# Patient Record
Sex: Female | Born: 1965 | Race: White | Hispanic: No | Smoking: Current every day smoker
Health system: Southern US, Community
[De-identification: ages and names within clinical notes are randomized; demographics above are authoritative.]

## PROBLEM LIST (undated history)

## (undated) DIAGNOSIS — Z973 Presence of spectacles and contact lenses: Secondary | ICD-10-CM

## (undated) DIAGNOSIS — Z9889 Other specified postprocedural states: Secondary | ICD-10-CM

## (undated) DIAGNOSIS — I10 Essential (primary) hypertension: Secondary | ICD-10-CM

## (undated) DIAGNOSIS — J45909 Unspecified asthma, uncomplicated: Secondary | ICD-10-CM

## (undated) DIAGNOSIS — Z8679 Personal history of other diseases of the circulatory system: Secondary | ICD-10-CM

## (undated) DIAGNOSIS — A630 Anogenital (venereal) warts: Secondary | ICD-10-CM

## (undated) DIAGNOSIS — Z8719 Personal history of other diseases of the digestive system: Secondary | ICD-10-CM

---

## 1997-12-17 ENCOUNTER — Inpatient Hospital Stay (HOSPITAL_COMMUNITY): Admission: EM | Admit: 1997-12-17 | Discharge: 1997-12-23 | Payer: Self-pay | Admitting: Emergency Medicine

## 1998-05-17 HISTORY — PX: OTHER SURGICAL HISTORY: SHX169

## 1998-09-12 ENCOUNTER — Other Ambulatory Visit: Admission: RE | Admit: 1998-09-12 | Discharge: 1998-09-12 | Payer: Self-pay | Admitting: Obstetrics and Gynecology

## 2003-01-07 ENCOUNTER — Encounter: Admission: RE | Admit: 2003-01-07 | Discharge: 2003-01-07 | Payer: Self-pay | Admitting: General Surgery

## 2003-01-07 ENCOUNTER — Encounter: Payer: Self-pay | Admitting: General Surgery

## 2003-01-16 ENCOUNTER — Ambulatory Visit (HOSPITAL_COMMUNITY): Admission: RE | Admit: 2003-01-16 | Discharge: 2003-01-16 | Payer: Self-pay | Admitting: Gastroenterology

## 2003-01-18 ENCOUNTER — Encounter: Payer: Self-pay | Admitting: General Surgery

## 2003-01-18 ENCOUNTER — Inpatient Hospital Stay (HOSPITAL_COMMUNITY): Admission: EM | Admit: 2003-01-18 | Discharge: 2003-01-19 | Payer: Self-pay | Admitting: *Deleted

## 2003-01-18 ENCOUNTER — Encounter (INDEPENDENT_AMBULATORY_CARE_PROVIDER_SITE_OTHER): Payer: Self-pay | Admitting: Specialist

## 2003-01-18 HISTORY — PX: LAPAROSCOPIC CHOLECYSTECTOMY: SUR755

## 2003-03-01 ENCOUNTER — Emergency Department (HOSPITAL_COMMUNITY): Admission: EM | Admit: 2003-03-01 | Discharge: 2003-03-01 | Payer: Self-pay | Admitting: Emergency Medicine

## 2003-03-01 ENCOUNTER — Encounter: Payer: Self-pay | Admitting: Emergency Medicine

## 2004-07-20 ENCOUNTER — Other Ambulatory Visit: Admission: RE | Admit: 2004-07-20 | Discharge: 2004-07-20 | Payer: Self-pay | Admitting: Obstetrics and Gynecology

## 2005-12-31 ENCOUNTER — Observation Stay (HOSPITAL_COMMUNITY): Admission: EM | Admit: 2005-12-31 | Discharge: 2006-01-02 | Payer: Self-pay | Admitting: Emergency Medicine

## 2006-01-07 ENCOUNTER — Ambulatory Visit (HOSPITAL_COMMUNITY): Admission: RE | Admit: 2006-01-07 | Discharge: 2006-01-07 | Payer: Self-pay | Admitting: Internal Medicine

## 2006-03-17 ENCOUNTER — Inpatient Hospital Stay (HOSPITAL_COMMUNITY): Admission: RE | Admit: 2006-03-17 | Discharge: 2006-03-20 | Payer: Self-pay | Admitting: Neurosurgery

## 2006-03-17 HISTORY — PX: OTHER SURGICAL HISTORY: SHX169

## 2007-06-22 ENCOUNTER — Emergency Department (HOSPITAL_COMMUNITY): Admission: EM | Admit: 2007-06-22 | Discharge: 2007-06-22 | Payer: Self-pay | Admitting: Emergency Medicine

## 2009-02-10 ENCOUNTER — Emergency Department (HOSPITAL_COMMUNITY): Admission: EM | Admit: 2009-02-10 | Discharge: 2009-02-10 | Payer: Self-pay | Admitting: Emergency Medicine

## 2010-06-06 ENCOUNTER — Encounter: Payer: Self-pay | Admitting: Radiology

## 2010-10-02 NOTE — H&P (Signed)
NAME:  Kendra Becker, Kendra Becker                          ACCOUNT NO.:  [id number]   MEDICAL RECORD NO.:  [id number]                   PATIENT TYPE:  INP   LOCATION:  0378                                 FACILITY:  [facility]   PHYSICIAN:  Adolph Pollack, M.D.            DATE OF BIRTH:  13-Jan-1966   DATE OF ADMISSION:  01/18/2003  DATE OF DISCHARGE:                                HISTORY & PHYSICAL   CHIEF COMPLAINT:  Increasing right upper quadrant pain radiating to the back  with nausea.   HISTORY OF PRESENT ILLNESS:  Ms. Kendra Becker is a 45 year old female who I saw in  the office January 03, 2003 with some biliary colic type pain, but also some  dyspeptic type pain.  She has known gallstones by a CT scan in 1999 and a  recent ultrasound also demonstrated the gallstones.  We talked about an  elective laparoscopic cholecystectomy but first I sent her to Petra Kuba,  M.D. to evaluate her for dyspepsia and a hiatal hernia was noted on an upper  GI evaluation.  The plan was to then set her up for elective  cholecystectomy.  She saw Jimmye Norman, M.D. in the urgent office yesterday  with more biliary colic type symptoms.  She presented to the emergency  department because of persistent pain this morning.  No fever.  Having  nausea.   PAST MEDICAL HISTORY:  1. Acute appendicitis.  2. Gastroesophageal reflux disease.  3. Hypertension.  4. Depression.  5. Hypercholesterolemia.   PAST SURGICAL HISTORY:  1. Laparoscopic appendectomy.  2. Partial cecectomy.   ALLERGIES:  None.   MEDICATIONS:  1. Nexium.  2. Prozac.  3. Uniretic.  4. Zantac.   SOCIAL HISTORY:  She smokes one and a half packs of cigarettes a day.  Occasional alcohol use.  She is divorced.  She does have a boyfriend.   FAMILY HISTORY:  Remarkable for the fact that her mother had gallbladder  disease.   REVIEW OF SYSTEMS:  CARDIOVASCULAR:  No known heart disease or valvular  disease.  PULMONARY:  No asthma, pneumonia,  tuberculosis.  GASTROINTESTINAL:  She stated that some time in the past a doctor told her she may have had  peptic ulcer disease.  No diverticulitis.  No hepatitis.  GENITOURINARY:  No  kidney stones or current urinary symptoms.  ENDOCRINE:  No diabetes or  thyroid disease.  She does have hypercholesterolemia that is diet controlled  by her report.  NEUROLOGIC:  No seizures.  HEMATOLOGIC:  No known bleeding  disorders or deep venous thrombosis.   PHYSICAL EXAMINATION:  GENERAL:  Slightly ill-appearing female, but very  pleasant and cooperative.  VITAL SIGNS:  Temperature 98.2, blood pressure 169/109, pulse 92.  HEENT:  Eyes:  Extraocular movements are intact.  No icterus noted.  NECK:  Supple without masses.  RESPIRATORY:  Breath sounds equal and clear.  Respirations unlabored.  CARDIOVASCULAR:  Heart demonstrates a regular rate and rhythm.  No murmur  heard.  No lower extremity edema.  ABDOMEN:  Soft with a small subumbilical scar as well as other small scars,  one in the right upper quadrant, one in the left groin area.  She has some  mild to moderate tenderness to palpation in the right upper quadrant, but  does have a positive Murphy's sign.  No masses felt.  EXTREMITIES:  Good muscle tone.  Full range of motion.   LABORATORY DATA:  White blood cell count 12,400 with a leftward shift.  Hemoglobin 14.1.  Liver function tests are within normal limits as is a  lipase.   IMPRESSION:  Biliary colic secondary to cholelithiasis and likely now has  evolved an acute cholecystitis as her pain is starting to become persistent.  Does have elevation of her white blood cell count.   PLAN:  Admit.  IV antibiotics.  Plan for laparoscopic cholecystectomy either  today or tomorrow.  In the office we have gone over procedure and the risks  and she understood those at that time.                                               Adolph Pollack, M.D.    Kari Baars  D:  01/18/2003  T:  01/18/2003   Job:  [id number]   cc:   Petra Kuba, M.D.  [address]  [place]  [place] [address]  Fax: [phone]

## 2010-10-02 NOTE — Op Note (Signed)
NAMEGESSELLE, FITZSIMONS NO.:  [id number]   MEDICAL RECORD NO.:  [id number]          PATIENT TYPE:  INP   LOCATION:  3112                         FACILITY:  [facility]   PHYSICIAN:  Cristi Loron, M.D.DATE OF BIRTH:  27-Oct-1965   DATE OF PROCEDURE:  03/17/2006  DATE OF DISCHARGE:                                 OPERATIVE REPORT   PREOPERATIVE DIAGNOSIS:  Anterior communicating artery aneurysm, unruptured.   POSTOPERATIVE DIAGNOSIS:  Anterior communicating artery aneurysm,  unruptured.   PROCEDURE:  Right pterional craniotomy for clipping of anterior  communicating artery segment aneurysm using microdissection.   SURGEON:  Cristi Loron, M.D.   ASSISTANT:  Hilda Lias, M.D.   ANESTHESIA:  General endotracheal.   ESTIMATED BLOOD LOSS:  100 cc.   SPECIMENS:  None.   DRAINS:  One subtemporal Jackson-Pratt drain.   COMPLICATIONS:  None.   BRIEF HISTORY:  The patient is a 45 year old white female, who was having  dizzy spells and was worked up with a brain MRI/MRA.  This demonstrated an  anterior communicating artery segment aneurysm.  This was followed up by a  cerebral arteriogram, which confirmed the aneurysm, which was approximately  a 5-mm anterior communicating artery segment aneurysm.  I discussed the  various treatment options with the patient, including serial imaging  studies/observation, aneurysm coiling and surgical clipping of the aneurysm.  The patient weighed the risks, benefits and alternatives of surgery and  decided to proceed with a craniotomy for clipping of the aneurysm.   DESCRIPTION OF PROCEDURE:  The patient was brought to the operating room by  the anesthesia team.  General endotracheal anesthesia was induced.  The  patient remained I the supine position.  A roll was placed under her right  shoulder and her calvarium was supported in a Mayfield 3-point headrest.  The head was turned to the left, exposing her right scalp.  The  right scalp  was then shaved and prepared with Betadine scrub and Betadine solution.  Sterile drapes were applied.  I then injected the area to be incised with  Marcaine with epinephrine solution.  I used a scalpel to make a typical  pterional-type incision on the right.  I used Raney clips for wound-edge  hemostasis.  I then used electrocautery to incise the temporalis fascia and  muscle.  I then used a periosteal elevator to expose the underlying  calvarium.  We used towel clips and rubber bands to provide exposure.  I  then used a high-speed drill to create a bur hole in the keyhole region,  and then I used the footplate device to create a typical right pterional  craniotomy type bone flap.  I then elevated the bone flap with a Penfield #1  and a periosteal elevator.   I should mention that prior to the craniotomy, we prepped the patient's  lumbar region, and I placed a lumbar drain using the Seldinger technique  without difficulty on the first pass.   At this point, we opened the lumbar drain.  I then incised the dura with the  15-blade scalpel and used  the Metzenbaum scissors the extend the opening of  the dura.  I tacked back the dural edges.  The brain was quite slack from  drainage of cerebrospinal fluid through the lumbar drain. Then, the body  halo was applied to the frame and we brought the operating microscope into  the field.  I carefully dissected along the inferior surface of the frontal  lobe and identified the right optic nerve through the arachnoid.  I used  microdissection to dissect the arachnoid to then expose the proximal  internal carotid artery just posterior to the optic nerve in the event of  aneurysmal rupture.  I did just enough dissection around the proximal  internal carotid artery to assure that we could place a temporary aneurysm  clip, if we had to.  I then used the microdissection and microscissors,  arachnoid knife, etc., to divide the proximal sylvian  fissure to provide a  little more exposure.  I then used the self-retaining retractors to very  gently elevate the frontal lobe off the floor of the calvarium, giving more  exposure to the basal cisterns.  I then changed the direction of my  dissection to medial, towards the interhemispheric fissure.  I easily  identified the distal A1 segment close to the interhemispheric fissure.  We  could see the very proximal segment of the aneurysm.  I then used suction  and irrigation and bipolar electrocautery to remove a minimal amount of the  gyrus rectus to give better exposure of the aneurysm.  This gave good  exposure of the aneurysm.  I dissected around it circumferentially, and then  I clearly identified the aneurysmal neck.  We identified both the right and  left A1 segment, the right and left A2 segment, and the anterior  communicating artery.  The aneurysm was pointing ventrally.  We then  selected a #5 Sugita aneurysm clip and placed the aneurysm clip along the  neck of the aneurysm, being careful not to occlude either A1 or a2 segments,  and we were able to spare the anterior communicating artery as well.  After  doing this, we carefully inspected the aneurysm clearly and the clip was  around the entire neck of the aneurysm, and there was no compromise of  either the A1 or A2 segments, or the anterior communicating artery.  We  confirmed this visually through visual inspection as well as by using the  micro-Doppler, which demonstrated flow through all of these segments.  We  then obtained hemostasis using bipolar electrocautery.  We irrigated the  wound out with Bacitracin solution to remove any blood from the subarachnoid  space.  We then placed a piece of Gelfoam, which had been soaked in  papaverine over the area of dissection around the aneurysm, and then we  removed the retractor.  We then reapproximated the patient's dura with interrupted 4-0 Nurolon suture, and then we placed a  large piece of Gelfoam  over the exposed dura, and then we reapproximated the patient's craniotomy  flap with titanium miniplates and screws.  We then placed a flat Al Pimple drain in the subtemporal space.  We reapproximated the temporalis  fascia and muscle with interrupted 2-0 Vicryl suture, and the galea with  interrupted 2-0 Vicryl suture.  We tunneled the drain out through a separate  stab wound.  We then reapproximated the patient's skin with stainless steel  staples.  The wound was then cleansed and coated with Bacitracin ointment.  A sterile dressing was  applied.  The drapes were removed and the patient was  subsequently extubated by the anesthesia team and transported to the  postanesthesia care unit in stable condition.  All sponge, instrument and  needle counts were correct at the end of this case.      Cristi Loron, M.D.  Electronically Signed     JDJ/MEDQ  D:  03/17/2006  T:  03/18/2006  Job:  [id number]   cc:   Hilda Lias, M.D.

## 2010-10-02 NOTE — Op Note (Signed)
NAME:  Kendra Becker, Kendra Becker                          ACCOUNT NO.:  [id number]   MEDICAL RECORD NO.:  [id number]                   PATIENT TYPE:  INP   LOCATION:  0378                                 FACILITY:  [facility]   PHYSICIAN:  Adolph Pollack, M.D.            DATE OF BIRTH:  1966-04-05   DATE OF PROCEDURE:  01/18/2003  DATE OF DISCHARGE:                                 OPERATIVE REPORT   PREOPERATIVE DIAGNOSIS:  Acute cholecystitis.   POSTOPERATIVE DIAGNOSIS:  Acute cholecystitis.   PROCEDURE:  Laparoscopic cholecystectomy.   SURGEON:  Adolph Pollack, M.D.   ASSISTANT:  Abigail Miyamoto, M.D.   ANESTHESIA:  General.   INDICATIONS FOR PROCEDURE:  Ms. Kendra Becker is a 45 year old female whose been  having biliary colic and was going to be set up for elective cholecystectomy  but having increasing frequency of pain and presents in the emergency  department with an elevated white blood cell count, right upper quadrant  pain, tenderness and some guarding. She subsequently was admitted, placed on  IV antibiotics and brought to the operating room.   TECHNIQUE:  She was seen in the holding room, brought to the operating room,  placed supine on the operating table and a general anesthetic was  administered. Her abdomen was sterilely prepped and draped. Local anesthetic  was infiltrated in the subumbilical region and previous transverse  subumbilical incision was reincised down to the fascia where a small  incision was made in the fascia and the peritoneal cavity was entered  bluntly and under direct vision. A pursestring suture of #0 Vicryl was  placed around the fascial edges. A Hasson trocar was introduced into the  peritoneal cavity and pneumoperitoneum created by insufflation of CO2 gas.   Next the laparoscope was introduced. From a previous appendectomy incision  adhesions were noted from the ascending colon to the right lower quadrant  abdominal wall. The patient was then  placed in the reverse Trendelenburg  position with the right side tilted slightly up. Under direct vision, an 11  mm trocar was placed through a similar size incision and two 5 mm trocars  placed through the right mid abdomen. The gallbladder was noted to be  somewhat edematous and mildly inflamed. It became more acutely inflamed as  we traveled toward the infundibulum. The fundus was grasped and retracted  toward the right shoulder. Using blunt dissection, I mobilized the  infundibulum of the gallbladder. The area around the cystic duct appeared to  be quite inflamed and edematous. I used blunt dissection to try to isolate  part of the cystic duct out. There was a small blood vessel directly on the  cystic duct. I put a clip just above the gallbladder cystic duct junction  and made a small incision in the cystic duct and the artery started to  bleed. I controlled this with cautery but also sealed off part of my  incision  in the cystic duct so I made another incision and by the time I  passed the cholangiocatheter in because of the friable inflamed tissue, the  cystic duct had avulsed itself from the gallbladder. I noted at the time  that there was a large stone that was obstructing the cystic duct at the  gallbladder neck. Because the liver function tests were normal, I decided to  not do a cholangiogram. The cystic duct stump was grasped, it was clipped  two times on the staying inside. The clips were solid all the way across and  there was no bile leak. Subsequently I began dissecting the gallbladder free  from the liver bed. I identified what appeared to be a large cystic artery,  but I wanted to trace all of this out because of its size and, when I did,  it was a large trunk with anterior and posterior branches.  I clipped the  trunk twice and __________inside and each branch once and divided them. I  then dissected the gallbladder free from the liver bed. It was very  edematous. Once  the gallbladder was dissected free from the liver bed, I put  it in an Endopouch bag. I then copiously irrigated out the gallbladder fossa  and bleeding points were controlled with the cautery. I inspected the area a  number of times and noted no bile leaking and also noted no bleeding.   Next, I irrigated the perihepatic area copiously with saline solution and  evacuated most of it. I then removed the gallbladder in the Endopouch bag  through the subumbilical incision and under laparoscopic vision closed the  subumbilical fascial defect by tightening up and tying down the pursestring  suture. The remaining trocars were removed and a pneumoperitoneum was  released. The skin incisions were closed with 4-0 Monocryl subcuticular  sutures. Steri-Strips and sterile dressings were applied.   She tolerated the procedure well without any apparent complications. She  subsequently was taken to the recovery room in satisfactory condition.                                               Adolph Pollack, M.D.    Kari Baars  D:  01/18/2003  T:  01/19/2003  Job:  [id number]   cc:   Petra Kuba, M.D.  [address]  [place]  [place] [address]  Fax: [phone]

## 2010-10-02 NOTE — Discharge Summary (Signed)
[name], [name] NO.:  [id number]   MEDICAL RECORD NO.:  [id number]          PATIENT TYPE:  INP   LOCATION:  3032                         FACILITY:  [facility]   PHYSICIAN:  Danae Orleans. Venetia Maxon, M.D.  DATE OF BIRTH:  [birth year]   DATE OF ADMISSION:  03/17/2006  DATE OF DISCHARGE:  03/20/2006                               DISCHARGE SUMMARY   REASON FOR ADMISSION:  1. Nonruptured cerebral aneurysm.  2. Tobacco use disorder.  3. Hypertension, not otherwise specified.  4. Esophageal reflux.  5. Personal history of tuberculosis.   FINAL DIAGNOSES:  1. Nonruptured cerebral aneurysm.  2. Tobacco use disorder.  3. Hypertension, not otherwise specified.  4. Esophageal reflux.  5. Personal history of tuberculosis.   HISTORY OF ILLNESS AND HOSPITAL COURSE:  Kendra Becker is a 45 year old  woman with an anterior communicating artery aneurysm which was found  after cerebral arteriogram.  It was elected for her to undergo pterional  craniotomy and clipping of aneurysm.  This was done, and she tolerated  the procedure without difficulty, and was doing mobilized, doing well on  March 20, 2006, and was discharged home, with instructions to follow  up in 10 days for staple removal.   DISCHARGE MEDICATIONS:  1. Percocet.  2. Dilantin.   DISCHARGE STATUS:  Good.      Danae Orleans. Venetia Maxon, M.D.  Electronically Signed     JDS/MEDQ  D:  05/05/2006  T:  05/06/2006  Job:  [id number]

## 2010-12-24 ENCOUNTER — Inpatient Hospital Stay (INDEPENDENT_AMBULATORY_CARE_PROVIDER_SITE_OTHER)
Admission: RE | Admit: 2010-12-24 | Discharge: 2010-12-24 | Disposition: A | Discharge status: Home / Self Care | Payer: BC Managed Care – PPO | Source: Ambulatory Visit | Attending: Emergency Medicine | Admitting: Emergency Medicine

## 2010-12-24 DIAGNOSIS — S139XXA Sprain of joints and ligaments of unspecified parts of neck, initial encounter: Secondary | ICD-10-CM

## 2011-02-05 LAB — INFLUENZA A AND B ANTIGEN (CONVERTED LAB): Influenza B Ag: NEGATIVE

## 2011-11-23 ENCOUNTER — Encounter (HOSPITAL_COMMUNITY): Payer: Self-pay | Admitting: *Deleted

## 2011-11-23 ENCOUNTER — Emergency Department (HOSPITAL_COMMUNITY)
Admission: EM | Admit: 2011-11-23 | Discharge: 2011-11-23 | Disposition: A | Discharge status: Home / Self Care | Payer: BC Managed Care – PPO | Attending: Emergency Medicine | Admitting: Emergency Medicine

## 2011-11-23 ENCOUNTER — Emergency Department (HOSPITAL_COMMUNITY): Payer: BC Managed Care – PPO

## 2011-11-23 DIAGNOSIS — M533 Sacrococcygeal disorders, not elsewhere classified: Secondary | ICD-10-CM

## 2011-11-23 DIAGNOSIS — R10819 Abdominal tenderness, unspecified site: Secondary | ICD-10-CM | POA: Insufficient documentation

## 2011-11-23 DIAGNOSIS — I10 Essential (primary) hypertension: Secondary | ICD-10-CM | POA: Insufficient documentation

## 2011-11-23 DIAGNOSIS — R110 Nausea: Secondary | ICD-10-CM | POA: Insufficient documentation

## 2011-11-23 DIAGNOSIS — R102 Pelvic and perineal pain: Secondary | ICD-10-CM

## 2011-11-23 DIAGNOSIS — N949 Unspecified condition associated with female genital organs and menstrual cycle: Secondary | ICD-10-CM | POA: Insufficient documentation

## 2011-11-23 DIAGNOSIS — M549 Dorsalgia, unspecified: Secondary | ICD-10-CM | POA: Insufficient documentation

## 2011-11-23 DIAGNOSIS — R350 Frequency of micturition: Secondary | ICD-10-CM | POA: Insufficient documentation

## 2011-11-23 HISTORY — DX: Essential (primary) hypertension: I10

## 2011-11-23 LAB — URINALYSIS, ROUTINE W REFLEX MICROSCOPIC
Glucose, UA: NEGATIVE mg/dL
Ketones, ur: NEGATIVE mg/dL
Leukocytes, UA: NEGATIVE
Nitrite: NEGATIVE
Protein, ur: NEGATIVE mg/dL

## 2011-11-23 LAB — WET PREP, GENITAL
WBC, Wet Prep HPF POC: NONE SEEN
Yeast Wet Prep HPF POC: NONE SEEN

## 2011-11-23 LAB — POCT PREGNANCY, URINE: Preg Test, Ur: NEGATIVE

## 2011-11-23 MED ORDER — TRAMADOL HCL 50 MG PO TABS: 50.0000 mg | ORAL_TABLET | Freq: Four times a day (QID) | ORAL | Status: AC | PRN

## 2011-11-23 MED ORDER — KETOROLAC TROMETHAMINE 60 MG/2ML IM SOLN: 60.0000 mg | Freq: Once | INTRAMUSCULAR | Status: DC

## 2011-11-23 MED ORDER — KETOROLAC TROMETHAMINE 30 MG/ML IJ SOLN
INTRAMUSCULAR | Status: AC
  Administered 2011-11-23: 60 mg
  Filled 2011-11-23: qty 2

## 2011-11-23 NOTE — ED Provider Notes (Signed)
History  This chart was scribed for Ward Givens, MD by Erskine Emery. This patient was seen in room TR07C/TR07C and the patient's care was started at 16:12.   CSN: [id number]  Arrival date & time 11/23/11  1513   First MD Initiated Contact with Patient 11/23/11 1612      Chief Complaint  Patient presents with  . Back Pain    (Consider location/radiation/quality/duration/timing/severity/associated sxs/prior treatment) HPI  Kendra Becker is a 46 y.o. female who presents to the Emergency Department complaining of worsening moderate back pain that radiates bilaterally to the lower abdomen. Patient states that the back pain has been intermittent for the past week, but became constant today. Pt reports associated pain after urination, nausea, hot flashes, and urinary frequency for the last couple days. Pt reports the pain is aggravated by walking and denies any vomiting, dysuria, vaginal discharge, or diarrhea. Pt reports this morning she had no pain starting to pee, but when she finished peeing, it felt like something was trying to come out of her but has not had that pain since. Pt denies any prior episodes of similar symptoms or any recent injuries. Pt denies any recent changes in activity. Pt denies any new sexual partners and reports a h/o tubal ligation. Pt is G#6 P#4 A#2 and her LNMP was 1.5-2 months ago.  Pt's PCP is [facility].   Past Medical History  Diagnosis Date  . Hypertension     Past Surgical History  Procedure Date  . Cerebral aneurysm repair   BTL  History reviewed. No pertinent family history.  History  Substance Use Topics  . Smoking status: Current Everyday Smoker -- 1.5 packs/day  . Smokeless tobacco: Not on file  . 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   Review of Systems    Constitutional: Negative for fever.  Gastrointestinal: Positive for nausea and abdominal pain. Negative for vomiting and diarrhea.  Genitourinary: Positive for frequency. Negative for dysuria and vaginal discharge.  Musculoskeletal: Positive for back pain.    Allergies  Review of patient's allergies indicates no known allergies.  Home Medications   Current Outpatient Rx  Name Route Sig Dispense Refill  . ALPRAZOLAM 0.5 MG PO TABS Oral Take 0.25 mg by mouth 2 (two) times daily as needed. For anxiety    . LISINOPRIL 20 MG PO TABS Oral Take 20 mg by mouth daily.      BP 177/108  Pulse 89  Temp 97.2 F (36.2 C) (Oral)  Resp 19  SpO2 99%  LMP 09/15/2011  Vital signs normal    Physical Exam  Nursing note and vitals reviewed. Constitutional: She is oriented to person, place, and time. She appears well-developed and well-nourished. No distress.  HENT:  Head: Normocephalic and atraumatic.  Mouth/Throat: Oropharynx is clear and moist.  Eyes: EOM are normal. Pupils are equal, round, and reactive to light.  Neck: Normal range of motion. Neck supple. No tracheal deviation present.  Cardiovascular: Normal rate, regular rhythm and normal heart sounds.   Pulmonary/Chest: Effort normal. No respiratory distress.       Rare wheeze.   Abdominal: Soft. Bowel sounds are normal. There is tenderness.  Tenderness diffusely in suprapubic region with no tenderness above the umbilical region.   Genitourinary:       Exam done after Toradol given which has improved her pain. Patient has normal external genitalia except for a few Pinero warts around her rectal area. Patient states her husband had a wart on his penis that fell off. Her uterus is normal sized and nontender, her adnexa are nontender and feel normal size. However patient feels much more comfortable than she did prior to receiving the Toradol. She has a small amount white discharge  Musculoskeletal: Normal range of motion. She  exhibits no edema.       Back tenderness over sacrum, not thoracic or lumbar.   Neurological: She is alert and oriented to person, place, and time.  Skin: Skin is warm and dry.  Psychiatric: She has a normal mood and affect. Her behavior is normal.    ED Course  Procedures (including critical care time)  Medications  ketorolac (TORADOL) injection 60 mg (60 mg Intramuscular Not Given 11/23/11 1737)  ketorolac (TORADOL) 30 MG/ML injection (60 mg  Given 11/23/11 1730)   Pt pain better after meds  20:10 turned over to P Dammen, PA to get [place] results and disposition  DIAGNOSTIC STUDIES: Oxygen Saturation is 99% on room air, normal by my interpretation.    COORDINATION OF CARE:  16:37--I discussed treatment plan including a pelvic exam with pt and pt agreed.  18:45--I checked in with the pt, informed her of current lab results. Will order U/S of pelvis and Transvaginal. Pt is agreeable at this time.    Pelvic US pending  1. Pelvic pain in female    Disposition per Orson Slick, PA   MDM  I personally performed the services described in this documentation, which was scribed in my presence. The recorded information has been reviewed and considered.  Devoria Albe, MD, Armando Gang          Ward Givens, MD 11/23/11 2016
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
## 2011-11-23 NOTE — ED Notes (Signed)
Patient transported to X-ray 

## 2011-11-23 NOTE — ED Notes (Signed)
Patient transported to Ultrasound 

## 2011-11-23 NOTE — ED Notes (Signed)
Inc. Worsening back pain, radiating to lower abd, but has resolved. Also, nauseated.

## 2011-11-24 NOTE — ED Provider Notes (Signed)
Medical screening examination/treatment/procedure(s) were performed by non-physician practitioner and as supervising physician I was immediately available for consultation/collaboration.    Blimy Napoleon R Ellouise Mcwhirter, MD 11/24/11 1504 

## 2013-01-25 ENCOUNTER — Other Ambulatory Visit: Payer: Self-pay | Admitting: Family Medicine

## 2013-01-25 DIAGNOSIS — Z1231 Encounter for screening mammogram for malignant neoplasm of breast: Secondary | ICD-10-CM

## 2013-02-07 ENCOUNTER — Inpatient Hospital Stay: Admission: RE | Admit: 2013-02-07 | Payer: BC Managed Care – PPO | Source: Ambulatory Visit

## 2013-04-16 ENCOUNTER — Encounter (HOSPITAL_COMMUNITY): Payer: Self-pay | Admitting: Emergency Medicine

## 2013-04-16 ENCOUNTER — Emergency Department (HOSPITAL_COMMUNITY): Payer: BC Managed Care – PPO

## 2013-04-16 ENCOUNTER — Emergency Department (HOSPITAL_COMMUNITY)
Admission: EM | Admit: 2013-04-16 | Discharge: 2013-04-17 | Disposition: A | Discharge status: Home / Self Care | Payer: BC Managed Care – PPO | Attending: Emergency Medicine | Admitting: Emergency Medicine

## 2013-04-16 DIAGNOSIS — F17200 Nicotine dependence, unspecified, uncomplicated: Secondary | ICD-10-CM | POA: Insufficient documentation

## 2013-04-16 DIAGNOSIS — Z79899 Other long term (current) drug therapy: Secondary | ICD-10-CM | POA: Insufficient documentation

## 2013-04-16 DIAGNOSIS — L03039 Cellulitis of unspecified toe: Secondary | ICD-10-CM | POA: Insufficient documentation

## 2013-04-16 DIAGNOSIS — L0390 Cellulitis, unspecified: Secondary | ICD-10-CM

## 2013-04-16 DIAGNOSIS — I10 Essential (primary) hypertension: Secondary | ICD-10-CM | POA: Insufficient documentation

## 2013-04-16 DIAGNOSIS — L02619 Cutaneous abscess of unspecified foot: Secondary | ICD-10-CM | POA: Insufficient documentation

## 2013-04-16 NOTE — ED Notes (Signed)
Pt. reports right toe pain with swelling onset Sunday morning , denies injury.

## 2013-04-17 MED ORDER — CEPHALEXIN 250 MG PO CAPS
250.0000 mg | ORAL_CAPSULE | Freq: Once | ORAL | Status: AC
  Administered 2013-04-17: 250 mg via ORAL
  Filled 2013-04-17: qty 1

## 2013-04-17 MED ORDER — CEPHALEXIN 250 MG PO CAPS: 250.0000 mg | ORAL_CAPSULE | Freq: Four times a day (QID) | ORAL | Status: DC

## 2013-04-17 MED ORDER — HYDROCODONE-ACETAMINOPHEN 5-325 MG PO TABS: 2.0000 | ORAL_TABLET | Freq: Four times a day (QID) | ORAL | Status: DC | PRN

## 2013-04-17 NOTE — ED Provider Notes (Signed)
CSN: [id number]     Arrival date & time 04/16/13  2216 History   First MD Initiated Contact with Patient 04/16/13 2359     Chief Complaint  Patient presents with  . Toe Pain   (Consider location/radiation/quality/duration/timing/severity/associated sxs/prior Treatment) HPI Comments: Noticed 2 days ago redness and tenderness medial R great toe without injury   Patient is a 47 y.o. female presenting with toe pain. The history is provided by the patient.  Toe Pain The current episode started yesterday. The problem occurs constantly. The problem has been gradually worsening. Pertinent negatives include no fever, joint swelling or numbness. The symptoms are aggravated by walking. She has tried nothing for the symptoms. The treatment provided no relief.    Past Medical History  Diagnosis Date  . Hypertension   . Brain aneurysm    Past Surgical History  Procedure Laterality Date  . Cerebral aneurysm repair     No family history on file. History  Substance Use Topics  . Smoking status: Current Every Day Smoker -- 1.50 packs/day  . Smokeless tobacco: Not on file  . Alcohol Use: 24.0 oz/week    40 Cans of beer per week   OB History   Grav Para Term Preterm Abortions TAB SAB Ect Mult Living                 Review of Systems  Constitutional: Negative for fever.  Musculoskeletal: Negative for joint swelling.  Skin: Positive for color change. Negative for wound.  Neurological: Negative for numbness.  All other systems reviewed and are negative.    Allergies  Review of patient's allergies indicates no known allergies.  Home Medications   Current Outpatient Rx  Name  Route  Sig  Dispense  Refill  . ALPRAZolam (XANAX) 0.5 MG tablet   Oral   Take 0.25 mg by mouth 2 (two) times daily as needed. For anxiety         . lisinopril (PRINIVIL,ZESTRIL) 20 MG tablet   Oral   Take 20 mg by mouth daily.         . cephALEXin (KEFLEX) 250 MG capsule   Oral   Take 1 capsule (250 mg  total) by mouth 4 (four) times daily.   39 capsule   0   . HYDROcodone-acetaminophen (NORCO/VICODIN) 5-325 MG per tablet   Oral   Take 2 tablets by mouth every 6 (six) hours as needed.   12 tablet   0    BP 127/91  Pulse 75  Temp(Src) 98.6 F (37 C) (Oral)  Resp 16  Wt 150 lb 6 oz (68.21 kg)  SpO2 98% Physical Exam  Nursing note and vitals reviewed. Constitutional: She appears well-developed and well-nourished.  HENT:  Head: Normocephalic.  Neck: Normal range of motion.  Cardiovascular: Normal rate.   Pulmonary/Chest: Effort normal.  Musculoskeletal: Normal range of motion. She exhibits no edema.       Right ankle: She exhibits normal range of motion, no swelling and no ecchymosis. Tenderness.       Feet:  No ingrown nail no fluctuance   Skin: There is erythema.    ED Course  Procedures (including critical care time) Labs Review Labs Reviewed - No data to display Imaging Review Dg Toe Great Right  04/16/2013   CLINICAL DATA:  Redness and swelling along medial right great toe  EXAM: RIGHT GREAT TOE  COMPARISON:  None.  FINDINGS: There is no evidence of fracture or dislocation. There is no evidence of arthropathy  or other focal bone abnormality. Soft tissues are unremarkable.  IMPRESSION: Negative.   Electronically Signed   By: Esperanza Heir M.D.   On: 04/16/2013 23:42    EKG Interpretation   None       MDM   1. Cellulitis   placed on Keflex QID and Vicodin for pain control with FU in 2 days with PCP  To return sooner if needed    Arman Filter, NP 04/18/13 [phone]

## 2013-04-18 NOTE — ED Provider Notes (Signed)
Medical screening examination/treatment/procedure(s) were performed by non-physician practitioner and as supervising physician I was immediately available for consultation/collaboration.    Olivia Mackie, MD 04/18/13 [phone]

## 2014-05-29 ENCOUNTER — Other Ambulatory Visit: Payer: Self-pay | Admitting: Family Medicine

## 2014-05-31 ENCOUNTER — Other Ambulatory Visit: Payer: Self-pay | Admitting: Family Medicine

## 2014-05-31 DIAGNOSIS — R42 Dizziness and giddiness: Secondary | ICD-10-CM

## 2014-05-31 DIAGNOSIS — R4020 Unspecified coma: Secondary | ICD-10-CM

## 2014-05-31 DIAGNOSIS — Z8679 Personal history of other diseases of the circulatory system: Secondary | ICD-10-CM

## 2015-09-21 ENCOUNTER — Inpatient Hospital Stay (HOSPITAL_COMMUNITY)
Admission: EM | Admit: 2015-09-21 | Discharge: 2015-09-24 | DRG: 392 | Disposition: A | Discharge status: Home / Self Care | Payer: Managed Care, Other (non HMO) | Attending: Internal Medicine | Admitting: Internal Medicine

## 2015-09-21 ENCOUNTER — Emergency Department (HOSPITAL_COMMUNITY): Payer: Managed Care, Other (non HMO)

## 2015-09-21 ENCOUNTER — Encounter (HOSPITAL_COMMUNITY): Payer: Self-pay | Admitting: Emergency Medicine

## 2015-09-21 DIAGNOSIS — Z8679 Personal history of other diseases of the circulatory system: Secondary | ICD-10-CM

## 2015-09-21 DIAGNOSIS — N730 Acute parametritis and pelvic cellulitis: Secondary | ICD-10-CM

## 2015-09-21 DIAGNOSIS — K5780 Diverticulitis of intestine, part unspecified, with perforation and abscess without bleeding: Secondary | ICD-10-CM | POA: Diagnosis not present

## 2015-09-21 DIAGNOSIS — I10 Essential (primary) hypertension: Secondary | ICD-10-CM | POA: Diagnosis present

## 2015-09-21 DIAGNOSIS — Z9049 Acquired absence of other specified parts of digestive tract: Secondary | ICD-10-CM

## 2015-09-21 DIAGNOSIS — L500 Allergic urticaria: Secondary | ICD-10-CM | POA: Diagnosis not present

## 2015-09-21 DIAGNOSIS — N760 Acute vaginitis: Secondary | ICD-10-CM | POA: Diagnosis present

## 2015-09-21 DIAGNOSIS — F17200 Nicotine dependence, unspecified, uncomplicated: Secondary | ICD-10-CM | POA: Diagnosis present

## 2015-09-21 DIAGNOSIS — K5792 Diverticulitis of intestine, part unspecified, without perforation or abscess without bleeding: Secondary | ICD-10-CM | POA: Diagnosis present

## 2015-09-21 DIAGNOSIS — Y92230 Patient room in hospital as the place of occurrence of the external cause: Secondary | ICD-10-CM | POA: Diagnosis not present

## 2015-09-21 DIAGNOSIS — F411 Generalized anxiety disorder: Secondary | ICD-10-CM | POA: Diagnosis not present

## 2015-09-21 DIAGNOSIS — R1030 Lower abdominal pain, unspecified: Secondary | ICD-10-CM | POA: Diagnosis not present

## 2015-09-21 DIAGNOSIS — Z720 Tobacco use: Secondary | ICD-10-CM

## 2015-09-21 DIAGNOSIS — R1032 Left lower quadrant pain: Secondary | ICD-10-CM

## 2015-09-21 DIAGNOSIS — R1031 Right lower quadrant pain: Secondary | ICD-10-CM

## 2015-09-21 DIAGNOSIS — K5720 Diverticulitis of large intestine with perforation and abscess without bleeding: Secondary | ICD-10-CM

## 2015-09-21 DIAGNOSIS — T373X5A Adverse effect of other antiprotozoal drugs, initial encounter: Secondary | ICD-10-CM | POA: Diagnosis not present

## 2015-09-21 DIAGNOSIS — K6289 Other specified diseases of anus and rectum: Secondary | ICD-10-CM | POA: Diagnosis present

## 2015-09-21 LAB — CBC
HCT: 44.3 % (ref 36.0–46.0)
HEMATOCRIT: 44.8 % (ref 36.0–46.0)
HEMOGLOBIN: 15.3 g/dL — AB (ref 12.0–15.0)
Hemoglobin: 15.3 g/dL — ABNORMAL HIGH (ref 12.0–15.0)
MCH: 31 pg (ref 26.0–34.0)
MCH: 31.4 pg (ref 26.0–34.0)
MCHC: 34.2 g/dL (ref 30.0–36.0)
MCHC: 34.5 g/dL (ref 30.0–36.0)
MCV: 90.9 fL (ref 78.0–100.0)
MCV: 90.8 fL (ref 78.0–100.0)
Platelets: 225 10*3/uL (ref 150–400)
Platelets: 189 10*3/uL (ref 150–400)
RBC: 4.93 MIL/uL (ref 3.87–5.11)
RBC: 4.88 MIL/uL (ref 3.87–5.11)
RDW: 13.3 % (ref 11.5–15.5)
RDW: 13.3 % (ref 11.5–15.5)
WBC: 14.5 10*3/uL — AB (ref 4.0–10.5)
WBC: 13.5 10*3/uL — ABNORMAL HIGH (ref 4.0–10.5)

## 2015-09-21 LAB — DIFFERENTIAL
BASOS ABS: 0 10*3/uL (ref 0.0–0.1)
BASOS PCT: 0 %
EOS ABS: 0.1 10*3/uL (ref 0.0–0.7)
Eosinophils Relative: 1 %
Lymphocytes Relative: 23 %
Lymphs Abs: 3.1 10*3/uL (ref 0.7–4.0)
MONOS PCT: 8 %
Monocytes Absolute: 1 10*3/uL (ref 0.1–1.0)
NEUTROS PCT: 68 %
Neutro Abs: 9.3 10*3/uL — ABNORMAL HIGH (ref 1.7–7.7)

## 2015-09-21 LAB — URINE MICROSCOPIC-ADD ON

## 2015-09-21 LAB — WET PREP, GENITAL
SPERM: NONE SEEN
Trich, Wet Prep: NONE SEEN
Yeast Wet Prep HPF POC: NONE SEEN

## 2015-09-21 LAB — LIPASE, BLOOD: LIPASE: 17 U/L (ref 11–51)

## 2015-09-21 LAB — COMPREHENSIVE METABOLIC PANEL
ALT: 18 U/L (ref 14–54)
ANION GAP: 10 (ref 5–15)
AST: 19 U/L (ref 15–41)
Albumin: 4.3 g/dL (ref 3.5–5.0)
Alkaline Phosphatase: 95 U/L (ref 38–126)
BUN: 10 mg/dL (ref 6–20)
CHLORIDE: 105 mmol/L (ref 101–111)
CO2: 26 mmol/L (ref 22–32)
Calcium: 9.4 mg/dL (ref 8.9–10.3)
Creatinine, Ser: 1.09 mg/dL — ABNORMAL HIGH (ref 0.44–1.00)
GFR calc non Af Amer: 59 mL/min — ABNORMAL LOW (ref >60)
Glucose, Bld: 118 mg/dL — ABNORMAL HIGH (ref 65–99)
Potassium: 3.8 mmol/L (ref 3.5–5.1)
SODIUM: 141 mmol/L (ref 135–145)
Total Bilirubin: 1.2 mg/dL (ref 0.3–1.2)
Total Protein: 7.9 g/dL (ref 6.5–8.1)

## 2015-09-21 LAB — URINALYSIS, ROUTINE W REFLEX MICROSCOPIC
Bilirubin Urine: NEGATIVE
GLUCOSE, UA: NEGATIVE mg/dL
Ketones, ur: NEGATIVE mg/dL
LEUKOCYTES UA: NEGATIVE
Nitrite: NEGATIVE
PH: 5.5 (ref 5.0–8.0)
Protein, ur: NEGATIVE mg/dL
Specific Gravity, Urine: 1.005 (ref 1.005–1.030)

## 2015-09-21 LAB — RAPID URINE DRUG SCREEN, HOSP PERFORMED
AMPHETAMINES: NOT DETECTED
Barbiturates: NOT DETECTED
Benzodiazepines: NOT DETECTED
Cocaine: NOT DETECTED
OPIATES: POSITIVE — AB
Tetrahydrocannabinol: POSITIVE — AB

## 2015-09-21 LAB — PREGNANCY, URINE: PREG TEST UR: NEGATIVE

## 2015-09-21 MED ORDER — METRONIDAZOLE IN NACL 5-0.79 MG/ML-% IV SOLN
500.0000 mg | Freq: Three times a day (TID) | INTRAVENOUS | Status: DC
  Administered 2015-09-22: 500 mg via INTRAVENOUS
  Filled 2015-09-21 (×2): qty 100

## 2015-09-21 MED ORDER — HYDRALAZINE HCL 20 MG/ML IJ SOLN
5.0000 mg | Freq: Three times a day (TID) | INTRAMUSCULAR | Status: DC | PRN
  Administered 2015-09-23: 5 mg via INTRAVENOUS
  Filled 2015-09-21: qty 1

## 2015-09-21 MED ORDER — AZITHROMYCIN 250 MG PO TABS
1000.0000 mg | ORAL_TABLET | Freq: Once | ORAL | Status: AC
  Administered 2015-09-21: 1000 mg via ORAL
  Filled 2015-09-21: qty 4

## 2015-09-21 MED ORDER — HYDROCODONE-ACETAMINOPHEN 5-325 MG PO TABS
2.0000 | ORAL_TABLET | Freq: Four times a day (QID) | ORAL | Status: DC | PRN
  Administered 2015-09-21 – 2015-09-23 (×6): 2 via ORAL
  Filled 2015-09-21 (×8): qty 2

## 2015-09-21 MED ORDER — DIATRIZOATE MEGLUMINE & SODIUM 66-10 % PO SOLN
15.0000 mL | Freq: Once | ORAL | Status: AC
  Administered 2015-09-21: 30 mL via ORAL

## 2015-09-21 MED ORDER — ENOXAPARIN SODIUM 40 MG/0.4ML ~~LOC~~ SOLN
40.0000 mg | SUBCUTANEOUS | Status: DC
  Administered 2015-09-21 – 2015-09-23 (×3): 40 mg via SUBCUTANEOUS
  Filled 2015-09-21 (×4): qty 0.4

## 2015-09-21 MED ORDER — IOPAMIDOL (ISOVUE-300) INJECTION 61%
100.0000 mL | Freq: Once | INTRAVENOUS | Status: AC | PRN
  Administered 2015-09-21: 100 mL via INTRAVENOUS

## 2015-09-21 MED ORDER — ONDANSETRON HCL 4 MG/2ML IJ SOLN
4.0000 mg | Freq: Once | INTRAMUSCULAR | Status: AC
  Administered 2015-09-21: 4 mg via INTRAVENOUS
  Filled 2015-09-21: qty 2

## 2015-09-21 MED ORDER — PIPERACILLIN-TAZOBACTAM 4.5 G IVPB: 4.5000 g | Freq: Once | INTRAVENOUS | Status: DC

## 2015-09-21 MED ORDER — DIATRIZOATE MEGLUMINE & SODIUM 66-10 % PO SOLN: 15.0000 mL | Freq: Once | ORAL | Status: DC

## 2015-09-21 MED ORDER — CIPROFLOXACIN IN D5W 400 MG/200ML IV SOLN
400.0000 mg | Freq: Once | INTRAVENOUS | Status: DC
  Administered 2015-09-21: 400 mg via INTRAVENOUS
  Filled 2015-09-21: qty 200

## 2015-09-21 MED ORDER — MORPHINE SULFATE (PF) 4 MG/ML IV SOLN
4.0000 mg | Freq: Once | INTRAVENOUS | Status: AC
  Administered 2015-09-21: 4 mg via INTRAVENOUS
  Filled 2015-09-21: qty 1

## 2015-09-21 MED ORDER — NICOTINE 21 MG/24HR TD PT24
21.0000 mg | MEDICATED_PATCH | Freq: Every day | TRANSDERMAL | Status: DC
  Administered 2015-09-21 – 2015-09-24 (×4): 21 mg via TRANSDERMAL
  Filled 2015-09-21 (×4): qty 1

## 2015-09-21 MED ORDER — PIPERACILLIN-TAZOBACTAM 3.375 G IVPB 30 MIN
3.3750 g | Freq: Once | INTRAVENOUS | Status: AC
  Administered 2015-09-21: 3.375 g via INTRAVENOUS
  Filled 2015-09-21: qty 50

## 2015-09-21 MED ORDER — LIDOCAINE HCL 1 % IJ SOLN
INTRAMUSCULAR | Status: AC
  Administered 2015-09-21: 0.9 mL
  Filled 2015-09-21: qty 20

## 2015-09-21 MED ORDER — ALPRAZOLAM 0.25 MG PO TABS
0.2500 mg | ORAL_TABLET | Freq: Two times a day (BID) | ORAL | Status: DC | PRN
  Administered 2015-09-22 – 2015-09-23 (×2): 0.25 mg via ORAL
  Filled 2015-09-21 (×2): qty 1

## 2015-09-21 MED ORDER — CIPROFLOXACIN IN D5W 400 MG/200ML IV SOLN
400.0000 mg | Freq: Two times a day (BID) | INTRAVENOUS | Status: DC
  Administered 2015-09-22: 400 mg via INTRAVENOUS
  Filled 2015-09-21: qty 200

## 2015-09-21 MED ORDER — POTASSIUM CHLORIDE IN NACL 20-0.9 MEQ/L-% IV SOLN
INTRAVENOUS | Status: DC
  Administered 2015-09-21 – 2015-09-24 (×4): via INTRAVENOUS
  Filled 2015-09-21 (×8): qty 1000

## 2015-09-21 MED ORDER — SODIUM CHLORIDE 0.9 % IV BOLUS (SEPSIS)
1000.0000 mL | Freq: Once | INTRAVENOUS | Status: AC
  Administered 2015-09-21: 1000 mL via INTRAVENOUS

## 2015-09-21 MED ORDER — METRONIDAZOLE IN NACL 5-0.79 MG/ML-% IV SOLN
500.0000 mg | Freq: Once | INTRAVENOUS | Status: DC
  Administered 2015-09-21: 500 mg via INTRAVENOUS
  Filled 2015-09-21: qty 100

## 2015-09-21 MED ORDER — CEFTRIAXONE SODIUM 250 MG IJ SOLR
250.0000 mg | Freq: Once | INTRAMUSCULAR | Status: AC
  Administered 2015-09-21: 250 mg via INTRAMUSCULAR
  Filled 2015-09-21: qty 250

## 2015-09-21 MED ORDER — ONDANSETRON HCL 4 MG/2ML IJ SOLN: 4.0000 mg | Freq: Four times a day (QID) | INTRAMUSCULAR | Status: DC | PRN

## 2015-09-21 NOTE — H&P (Signed)
History and Physical  Kendra Becker ZOX:[id number] DOB: 16-Apr-1966 DOA: 09/21/2015  Referring physician: EDP PCP: Lilia Argue   Chief Complaint: abdominal pain  HPI: Kendra Becker is a 50 y.o. 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   ED course: her vital is stable, CT ab showed diverticulitis with microperforation. Wet prep with clue cells, labs, wbc 14.5, cr 1.09, otherwise unremarkable, she is given abx in the ED, EDP consulted general surgery, hospitalist call to admit the patient.  Review of Systems:  Detail per HPI, Review of systems are otherwise negative  Past Medical History  Diagnosis Date  . Hypertension   . Brain aneurysm    Past Surgical History  Procedure Laterality Date  . Cerebral aneurysm repair     Social History:  reports that she has been smoking.  She does not have any smokeless tobacco history on file. She reports that she drinks about 24.0 oz of alcohol per week. She reports that she does not use illicit drugs. Patient lives at home & is able to participate in activities of daily living independently   No Known Allergies  History reviewed. No pertinent family history.    Prior to Admission medications   Medication Sig Start Date End Date Taking? Authorizing Provider  ALPRAZolam Prudy Feeler) 0.5 MG tablet Take 0.25 mg by mouth 2 (two) times daily as needed. Reported on 09/21/2015    Historical Provider, MD  cephALEXin (KEFLEX) 250 MG capsule Take 1 capsule (250 mg total) by mouth 4 (four) times daily. 04/17/13   Earley Favor, NP  HYDROcodone-acetaminophen (NORCO/VICODIN) 5-325 MG per tablet Take 2 tablets by mouth every 6 (six) hours as needed. 04/17/13   Earley Favor, NP  lisinopril (PRINIVIL,ZESTRIL) 20 MG tablet Take 20 mg by mouth daily. Reported on 09/21/2015    Historical Provider, MD    Physical Exam: BP 153/100 mmHg  Pulse 100  Temp(Src) 98.4 F (36.9 C) (Oral)  Resp 20  Ht  (1.727 m)  Wt 74.844 kg (165 lb)  BMI 25.09 kg/m2  SpO2 94%  General:  NAD Eyes: PERRL ENT: unremarkable Neck: supple, no JVD Cardiovascular: RRR Respiratory: CTABL Abdomen: tender/gaurding lower abdomen, no rebound, positive bowel sounds Skin: no rash Musculoskeletal:  No edema Psychiatric: calm/cooperative Neurologic: no focal findings            Labs on Admission:  Basic Metabolic Panel:  Recent Labs Lab 09/21/15 1435  NA 141  K 3.8  CL 105  CO2 26  GLUCOSE 118*  BUN 10  CREATININE 1.09*  CALCIUM 9.4   Liver Function Tests:  Recent Labs Lab 09/21/15 1435  AST 19  ALT 18  ALKPHOS 95  BILITOT 1.2  PROT 7.9  ALBUMIN 4.3    Recent Labs Lab 09/21/15 1435  LIPASE 17   No results for input(s): AMMONIA in the last 168 hours. CBC:  Recent Labs Lab 09/21/15 1435 09/21/15 1632  WBC 14.5* 13.5*  NEUTROABS  --  9.3*  HGB 15.3* 15.3*  HCT 44.8 44.3  MCV 90.9 90.8  PLT 225 189   Cardiac Enzymes: No results for input(s): CKTOTAL, CKMB, CKMBINDEX, TROPONINI in the last 168 hours.  BNP (last 3 results) No results for input(s):  BNP in the last 8760 hours.  ProBNP (last 3 results) No results for input(s): PROBNP in the last 8760 hours.  CBG: No results for input(s): GLUCAP in the last 168 hours.  Radiological Exams on Admission: Ct Abdomen Pelvis W Contrast  09/21/2015  CLINICAL DATA:  Lower abdominal pain. EXAM: CT ABDOMEN AND PELVIS WITH CONTRAST TECHNIQUE: Multidetector CT imaging of the abdomen and pelvis was performed using the standard protocol following bolus administration of intravenous contrast. CONTRAST:  100mL ISOVUE-300 IOPAMIDOL (ISOVUE-300) INJECTION 61% COMPARISON:  None. FINDINGS: The lung bases are  normal. No free air. A small amount of free fluid is seen in the pelvis. Patient status post cholecystectomy. There is resulting mild prominence of the intra and extrahepatic bile ducts. The liver and portal vein are normal. The spleen pancreas, and right adrenal gland are normal. Minimal thickening of the left adrenal gland is likely hyperplasia. Probable tiny cyst in the left kidney too small to characterize. No hydronephrosis or perinephric stranding. Atherosclerosis in the aorta without aneurysm or dissection. No adenopathy is identified on today's study. The stomach and small bowel are normal. There is diverticulitis associated with the low sigmoid colon. There is wall thickening and significant pericolonic fat stranding surrounding 1 of the diverticula. There is an air bubble within the fat stranding which could represent a micro perforation. There is no abscess formation adjacent to the colon or in the wall of the colon. The remainder of the colon is unremarkable. The patient is status post appendectomy. No adenopathy seen in the pelvis. The uterus and adnexae are unremarkable. Diverticulitis as described above. The bladder is normal. Delayed images through the upper abdomen demonstrate no filling defects in the renal collecting systems. Visualized bones are normal. IMPRESSION: 1. Diverticulitis with pericolonic fat stranding and wall thickening. There is a focus of air within the fat stranding without free air elsewhere in the abdomen. This suggests a micro perforation without abscess formation. Recommend follow-up as clinically warranted. Electronically Signed   By: Gerome Samavid  Williams III M.D   On: 09/21/2015 17:59     Assessment/Plan Present on Admission:  **None**  Diverticulitis with microperforation: iv abx with cipro/flagyl, ivf, clear liquid diet, prn pain and antiemetics, general surgery consulted by EDP.  Bacterial vaginosis: on flagyl  Smoking cessation education provided, nicotine  patch  HTN: hold oral bp med for now, prn hydralazine    DVT prophylaxis: lovenox   Consultants: general surgery consulted by EDP  Code Status: full   Family Communication:  Patient   Disposition Plan: admit to med surg  Time spent: 60mins  Vaishali Baise MD, PhD [facility] Pager [phone] If 7PM-7AM, please contact night-coverage at [email or web address], password [email or web address]

## 2015-09-21 NOTE — Consult Note (Signed)
Reason for Consult: Acute sigmoid diverticulitis Referring Physician:  Dr. Anise Salvo is an 50 y.o. female.  HPI:   She was in her usual state of health until Friday when she developed some abdominal distention and discomfort. This progressively worsened and turned into a lower abdominal cramping pain. She tried Gas-X without relief. The pain became so severe that she presented to the emergency department and had CT findings consistent with acute, uncomplicated diverticulitis. She states she's not had anything like this before. We were asked to see her because of this.  Past Medical History  Diagnosis Date  . Hypertension   . Brain aneurysm       Perforated appendicitis  Past Surgical History  Procedure Laterality Date  . Cerebral aneurysm repair         Appendectomy with segmental intestinal resection  History reviewed. No pertinent family history.  Social History:  reports that she has been smoking.  She does not have any smokeless tobacco history on file. She reports that she drinks about 24.0 oz of alcohol per week. She reports that she does not use illicit drugs.  Works as a Radiation protection practitioner.  Allergies: No Known Allergies   Prior to Admission medications   Medication Sig Start Date End Date Taking? Authorizing Provider  ALPRAZolam Duanne Moron) 0.5 MG tablet Take 0.25 mg by mouth 2 (two) times daily as needed. Reported on 09/21/2015    Historical Provider, MD  cephALEXin (KEFLEX) 250 MG capsule Take 1 capsule (250 mg total) by mouth 4 (four) times daily. 04/17/13   Junius Creamer, NP  HYDROcodone-acetaminophen (NORCO/VICODIN) 5-325 MG per tablet Take 2 tablets by mouth every 6 (six) hours as needed. 04/17/13   Junius Creamer, NP  lisinopril (PRINIVIL,ZESTRIL) 20 MG tablet Take 20 mg by mouth daily. Reported on 09/21/2015    Historical Provider, MD        Results for orders placed or performed during the hospital encounter of 09/21/15 (from the past 48 hour(s))  Lipase, blood      Status: None   Collection Time: 09/21/15  2:35 PM  Result Value Ref Range   Lipase 17 11 - 51 U/L  Comprehensive metabolic panel     Status: Abnormal   Collection Time: 09/21/15  2:35 PM  Result Value Ref Range   Sodium 141 135 - 145 mmol/L   Potassium 3.8 3.5 - 5.1 mmol/L   Chloride 105 101 - 111 mmol/L   CO2 26 22 - 32 mmol/L   Glucose, Bld 118 (H) 65 - 99 mg/dL   BUN 10 6 - 20 mg/dL   Creatinine, Ser 1.09 (H) 0.44 - 1.00 mg/dL   Calcium 9.4 8.9 - 10.3 mg/dL   Total Protein 7.9 6.5 - 8.1 g/dL   Albumin 4.3 3.5 - 5.0 g/dL   AST 19 15 - 41 U/L   ALT 18 14 - 54 U/L   Alkaline Phosphatase 95 38 - 126 U/L   Total Bilirubin 1.2 0.3 - 1.2 mg/dL   GFR calc non Af Amer 59 (L) >60 mL/min   GFR calc Af Amer >60 >60 mL/min    Comment: (NOTE) The eGFR has been calculated using the CKD EPI equation. This calculation has not been validated in all clinical situations. eGFR's persistently <60 mL/min signify possible Chronic Kidney Disease.    Anion gap 10 5 - 15  CBC     Status: Abnormal   Collection Time: 09/21/15  2:35 PM  Result Value Ref Range  WBC 14.5 (H) 4.0 - 10.5 K/uL   RBC 4.93 3.87 - 5.11 MIL/uL   Hemoglobin 15.3 (H) 12.0 - 15.0 g/dL   HCT 44.8 36.0 - 46.0 %   MCV 90.9 78.0 - 100.0 fL   MCH 31.0 26.0 - 34.0 pg   MCHC 34.2 30.0 - 36.0 g/dL   RDW 13.3 11.5 - 15.5 %   Platelets 225 150 - 400 K/uL  Urinalysis, Routine w reflex microscopic     Status: Abnormal   Collection Time: 09/21/15  3:31 PM  Result Value Ref Range   Color, Urine YELLOW YELLOW   APPearance CLOUDY (A) CLEAR   Specific Gravity, Urine 1.005 1.005 - 1.030   pH 5.5 5.0 - 8.0   Glucose, UA NEGATIVE NEGATIVE mg/dL   Hgb urine dipstick SMALL (A) NEGATIVE   Bilirubin Urine NEGATIVE NEGATIVE   Ketones, ur NEGATIVE NEGATIVE mg/dL   Protein, ur NEGATIVE NEGATIVE mg/dL   Nitrite NEGATIVE NEGATIVE   Leukocytes, UA NEGATIVE NEGATIVE  Urine microscopic-add on     Status: Abnormal   Collection Time: 09/21/15   3:31 PM  Result Value Ref Range   Squamous Epithelial / LPF 0-5 (A) NONE SEEN   WBC, UA 0-5 0 - 5 WBC/hpf   RBC / HPF 0-5 0 - 5 RBC/hpf   Bacteria, UA FEW (A) NONE SEEN  Wet prep, genital     Status: Abnormal   Collection Time: 09/21/15  4:16 PM  Result Value Ref Range   Yeast Wet Prep HPF POC NONE SEEN NONE SEEN   Trich, Wet Prep NONE SEEN NONE SEEN   Clue Cells Wet Prep HPF POC PRESENT (A) NONE SEEN   WBC, Wet Prep HPF POC MODERATE (A) NONE SEEN   Sperm NONE SEEN   Differential     Status: Abnormal   Collection Time: 09/21/15  4:32 PM  Result Value Ref Range   Neutrophils Relative % 68 %   Neutro Abs 9.3 (H) 1.7 - 7.7 K/uL   Lymphocytes Relative 23 %   Lymphs Abs 3.1 0.7 - 4.0 K/uL   Monocytes Relative 8 %   Monocytes Absolute 1.0 0.1 - 1.0 K/uL   Eosinophils Relative 1 %   Eosinophils Absolute 0.1 0.0 - 0.7 K/uL   Basophils Relative 0 %   Basophils Absolute 0.0 0.0 - 0.1 K/uL  CBC     Status: Abnormal   Collection Time: 09/21/15  4:32 PM  Result Value Ref Range   WBC 13.5 (H) 4.0 - 10.5 K/uL   RBC 4.88 3.87 - 5.11 MIL/uL   Hemoglobin 15.3 (H) 12.0 - 15.0 g/dL   HCT 44.3 36.0 - 46.0 %   MCV 90.8 78.0 - 100.0 fL   MCH 31.4 26.0 - 34.0 pg   MCHC 34.5 30.0 - 36.0 g/dL   RDW 13.3 11.5 - 15.5 %   Platelets 189 150 - 400 K/uL    Ct Abdomen Pelvis W Contrast  09/21/2015  CLINICAL DATA:  Lower abdominal pain. EXAM: CT ABDOMEN AND PELVIS WITH CONTRAST TECHNIQUE: Multidetector CT imaging of the abdomen and pelvis was performed using the standard protocol following bolus administration of intravenous contrast. CONTRAST:  157m ISOVUE-300 IOPAMIDOL (ISOVUE-300) INJECTION 61% COMPARISON:  None. FINDINGS: The lung bases are normal. No free air. A small amount of free fluid is seen in the pelvis. Patient status post cholecystectomy. There is resulting mild prominence of the intra and extrahepatic bile ducts. The liver and portal vein are normal. The spleen pancreas, and  right adrenal  gland are normal. Minimal thickening of the left adrenal gland is likely hyperplasia. Probable tiny cyst in the left kidney too small to characterize. No hydronephrosis or perinephric stranding. Atherosclerosis in the aorta without aneurysm or dissection. No adenopathy is identified on today's study. The stomach and small bowel are normal. There is diverticulitis associated with the low sigmoid colon. There is wall thickening and significant pericolonic fat stranding surrounding 1 of the diverticula. There is an air bubble within the fat stranding which could represent a micro perforation. There is no abscess formation adjacent to the colon or in the wall of the colon. The remainder of the colon is unremarkable. The patient is status post appendectomy. No adenopathy seen in the pelvis. The uterus and adnexae are unremarkable. Diverticulitis as described above. The bladder is normal. Delayed images through the upper abdomen demonstrate no filling defects in the renal collecting systems. Visualized bones are normal. IMPRESSION: 1. Diverticulitis with pericolonic fat stranding and wall thickening. There is a focus of air within the fat stranding without free air elsewhere in the abdomen. This suggests a micro perforation without abscess formation. Recommend follow-up as clinically warranted. Electronically Signed   By: Dorise Bullion III M.D   On: 09/21/2015 17:59    Review of Systems  Constitutional: Positive for chills. Negative for fever.  Cardiovascular: Negative for chest pain.  Gastrointestinal: Positive for nausea and abdominal pain. Negative for vomiting, diarrhea and blood in stool.  Genitourinary: Positive for dysuria. Negative for hematuria.   Blood pressure 130/91, pulse 81, temperature 98.5 F (36.9 C), temperature source Oral, resp. rate 17, height 5' 8"  (1.727 m), weight 74.844 kg (165 lb), SpO2 98 %. Physical Exam  Constitutional: She appears well-developed and well-nourished. No distress.   HENT:  Head: Normocephalic and atraumatic.  Cardiovascular: Normal rate and regular rhythm.   Respiratory: Effort normal and breath sounds normal.  GI: Soft. She exhibits no mass. There is tenderness (LLQ-moderate). There is no guarding.  RLQ scar  Musculoskeletal: She exhibits no edema.  Neurological: She is alert.  Skin: Skin is warm and dry.  Psychiatric: She has a normal mood and affect. Her behavior is normal.    Assessment/Plan: Acute sigmoid diverticulitis without evidence of abscess.  No indication for acute surgical intervention at this time.  Recommend: Broad-spectrum IV antibiotics, bowel rest. When pain and tenderness resolve, would start liquid diet and advance to low residue diet. When this is tolerated, could discharge him home antibiotics with follow-up with primary care physician.  Recommend colonoscopy 6-8 weeks after antibiotic cessation.  Renu Asby J 09/21/2015, 8:46 PM

## 2015-09-21 NOTE — ED Notes (Signed)
Patient transported to CT 

## 2015-09-21 NOTE — ED Provider Notes (Signed)
CSN: [id number]     Arrival date & time 09/21/15  1349 History   First MD Initiated Contact with Patient 09/21/15 1522     Chief Complaint  Patient presents with  . Abdominal Pain     (Consider location/radiation/quality/duration/timing/severity/associated sxs/prior Treatment) HPI Comments: Pt comes in with cc of lower abd pain. The pain started on Friday. Her pain is constant, with waxing and waning intensity - like labor pain almost. She has nausea, loose BM - no emesis. Pt has increased discomfort with urination an defecation. She feels bloated and "gassy." Pt has had hx of appendectomy in the past. Pt denies vaginal discharge. Reports no hx of pelvic disorders. Pt does admit that she doesn't trust her husband, he might be seeing other people, and they are still having unprotected intercourse when they see each other. No hx of pain like this in the past.   ROS 10 Systems reviewed and are negative for acute change except as noted in the HPI.      Patient is a 50 y.o. female presenting with abdominal pain. The history is provided by the patient.  Abdominal Pain   Past Medical History  Diagnosis Date  . Hypertension   . Brain aneurysm    Past Surgical History  Procedure Laterality Date  . Cerebral aneurysm repair     History reviewed. No pertinent family history. Social History  Substance Use Topics  . Smoking status: Current Every Day Smoker -- 1.50 packs/day  . Smokeless tobacco: None  . Alcohol Use: 24.0 oz/week    40 Cans of beer per week   OB History    No data available     Review of Systems  Gastrointestinal: Positive for abdominal pain.      Allergies  Review of patient's allergies indicates no known allergies.  Home Medications   Prior to Admission medications   Medication Sig Start Date End Date Taking? Authorizing Provider  ALPRAZolam Prudy Feeler) 0.5 MG tablet Take 0.25 mg by mouth 2 (two) times daily as needed. Reported on 09/21/2015    Historical  Provider, MD  cephALEXin (KEFLEX) 250 MG capsule Take 1 capsule (250 mg total) by mouth 4 (four) times daily. 04/17/13   Earley Favor, NP  HYDROcodone-acetaminophen (NORCO/VICODIN) 5-325 MG per tablet Take 2 tablets by mouth every 6 (six) hours as needed. 04/17/13   Earley Favor, NP  lisinopril (PRINIVIL,ZESTRIL) 20 MG tablet Take 20 mg by mouth daily. Reported on 09/21/2015    Historical Provider, MD   BP 153/100 mmHg  Pulse 100  Temp(Src) 98.4 F (36.9 C) (Oral)  Resp 20  Ht  (1.727 m)  Wt 165 lb (74.844 kg)  BMI 25.09 kg/m2  SpO2 94% Physical Exam  Constitutional: She is oriented to person, place, and time. She appears well-developed.  HENT:  Head: Normocephalic and atraumatic.  Eyes: Conjunctivae and EOM are normal. Pupils are equal, round, and reactive to light.  Neck: Normal range of motion. Neck supple.  Cardiovascular: Normal rate, regular rhythm, normal heart sounds and intact distal pulses.   No murmur heard. Pulmonary/Chest: Effort normal. No respiratory distress. She has no wheezes.  Abdominal: Soft. Bowel sounds are normal. She exhibits no distension. There is tenderness. There is guarding. There is no rebound.  Lower quadrant tenderness with voluntary guarding  Genitourinary: Vagina normal and uterus normal.  External exam - normal, no lesions Speculum exam: Pt has some white discharge, no blood Bimanual exam: Patient HAS CMT, no adnexal tenderness or fullness and cervical  os is closed  Neurological: She is alert and oriented to person, place, and time.  Skin: Skin is warm and dry.  Nursing note and vitals reviewed.   ED Course  Procedures (including critical care time) Labs Review Labs Reviewed  WET PREP, GENITAL - Abnormal; Notable for the following:    Clue Cells Wet Prep HPF POC PRESENT (*)    WBC, Wet Prep HPF POC MODERATE (*)    All other components within normal limits  COMPREHENSIVE METABOLIC PANEL - Abnormal; Notable for the following:    Glucose, Bld  118 (*)    Creatinine, Ser 1.09 (*)    GFR calc non Af Amer 59 (*)    All other components within normal limits  CBC - Abnormal; Notable for the following:    WBC 14.5 (*)    Hemoglobin 15.3 (*)    All other components within normal limits  URINALYSIS, ROUTINE W REFLEX MICROSCOPIC (NOT AT [facility]) - Abnormal; Notable for the following:    APPearance CLOUDY (*)    Hgb urine dipstick SMALL (*)    All other components within normal limits  DIFFERENTIAL - Abnormal; Notable for the following:    Neutro Abs 9.3 (*)    All other components within normal limits  URINE MICROSCOPIC-ADD ON - Abnormal; Notable for the following:    Squamous Epithelial / LPF 0-5 (*)    Bacteria, UA FEW (*)    All other components within normal limits  CBC - Abnormal; Notable for the following:    WBC 13.5 (*)    Hemoglobin 15.3 (*)    All other components within normal limits  URINE CULTURE  LIPASE, BLOOD  HIV ANTIBODY (ROUTINE TESTING)  PREGNANCY, URINE  CBC  BASIC METABOLIC PANEL  MAGNESIUM  TSH  HEMOGLOBIN A1C  URINE RAPID DRUG SCREEN, HOSP PERFORMED  I-STAT CG4 LACTIC ACID, ED  GC/CHLAMYDIA PROBE AMP ([place]) NOT AT [facility]    Imaging Review Ct Abdomen Pelvis W Contrast  09/21/2015  CLINICAL DATA:  Lower abdominal pain. EXAM: CT ABDOMEN AND PELVIS WITH CONTRAST TECHNIQUE: Multidetector CT imaging of the abdomen and pelvis was performed using the standard protocol following bolus administration of intravenous contrast. CONTRAST:  100mL ISOVUE-300 IOPAMIDOL (ISOVUE-300) INJECTION 61% COMPARISON:  None. FINDINGS: The lung bases are normal. No free air. A small amount of free fluid is seen in the pelvis. Patient status post cholecystectomy. There is resulting mild prominence of the intra and extrahepatic bile ducts. The liver and portal vein are normal. The spleen pancreas, and right adrenal gland are normal. Minimal thickening of the left adrenal gland is likely hyperplasia. Probable tiny cyst in the left  kidney too small to characterize. No hydronephrosis or perinephric stranding. Atherosclerosis in the aorta without aneurysm or dissection. No adenopathy is identified on today's study. The stomach and small bowel are normal. There is diverticulitis associated with the low sigmoid colon. There is wall thickening and significant pericolonic fat stranding surrounding 1 of the diverticula. There is an air bubble within the fat stranding which could represent a micro perforation. There is no abscess formation adjacent to the colon or in the wall of the colon. The remainder of the colon is unremarkable. The patient is status post appendectomy. No adenopathy seen in the pelvis. The uterus and adnexae are unremarkable. Diverticulitis as described above. The bladder is normal. Delayed images through the upper abdomen demonstrate no filling defects in the renal collecting systems. Visualized bones are normal. IMPRESSION: 1. Diverticulitis with pericolonic  fat stranding and wall thickening. There is a focus of air within the fat stranding without free air elsewhere in the abdomen. This suggests a micro perforation without abscess formation. Recommend follow-up as clinically warranted. Electronically Signed   By: Gerome Sam III M.D   On: 09/21/2015 17:59   I have personally reviewed and evaluated these images and lab results as part of my medical decision-making.   EKG Interpretation None      MDM   Final diagnoses:  PID (acute pelvic inflammatory disease)  Bilateral lower abdominal pain  Diverticulitis of large intestine with perforation without bleeding    Pt comes in with lower quadrant abdominal tenderness. She has some discomfort in the lower quadrants, and the pain is described as labor type pain that is worse with urinating or with defecation. She has guarding in the lower quadrants. When asked about social hx, she admits to unprotected intercourse with her husband who is not reliable and might be  having unprotected sex with others. This history prompted to a pelvic exam, where pt has slight yellow discharge, with cervical motion tenderness. Labs show elevated WC. Reassuring otherwise. Pt was tachycardic at arrival. We will give some ivf.  Clinical suspicion is high for PID. Pt isnt toxic appearing, and we dont think she has TOA, especially since the symptoms just started on Friday.  That being said, pt is 50 y/o - not fitting the demographics that typically has PID - and her concerns of husbands affair is just speculation for now, and there might actually might be an intra-abd infection. Will make shared decision with the patient.   6:51 PM Dr. Abbey Chatters consulted per the request of Medicine team. They will see the patient tomorrow. He recommends Zosyn as the antibiotic of choice, so we will cancer cipro and flagyl order. Medicine to admit.   Derwood Kaplan, MD 09/21/15 [phone]

## 2015-09-21 NOTE — ED Notes (Signed)
Unable to collect labs MD at bedside. 

## 2015-09-21 NOTE — ED Notes (Addendum)
Lower abdominal pain since Friday, worse with urination. Denies other urinary symptoms. Has been taking gas x with no relief. Last normal BM was yesterday. Lower abdomin appears distended. Tender to touch. C/o nausea, and occasional diarrhea. Denies vomiting. States she hasn't taken her HTN medication. Hasn't had a period in 3 years, but the pain "comes and goes like labor pains." Denies vaginal bleeding/discharge.

## 2015-09-21 NOTE — ED Notes (Signed)
MD at bedside. 

## 2015-09-22 DIAGNOSIS — F411 Generalized anxiety disorder: Secondary | ICD-10-CM

## 2015-09-22 LAB — CBC
HCT: 38.8 % (ref 36.0–46.0)
HEMOGLOBIN: 13.1 g/dL (ref 12.0–15.0)
MCH: 30.8 pg (ref 26.0–34.0)
MCHC: 33.8 g/dL (ref 30.0–36.0)
MCV: 91.1 fL (ref 78.0–100.0)
Platelets: 183 10*3/uL (ref 150–400)
RBC: 4.26 MIL/uL (ref 3.87–5.11)
RDW: 13.4 % (ref 11.5–15.5)
WBC: 8 10*3/uL (ref 4.0–10.5)

## 2015-09-22 LAB — BASIC METABOLIC PANEL
ANION GAP: 9 (ref 5–15)
BUN: 10 mg/dL (ref 6–20)
CALCIUM: 8.4 mg/dL — AB (ref 8.9–10.3)
CO2: 25 mmol/L (ref 22–32)
CREATININE: 0.92 mg/dL (ref 0.44–1.00)
Chloride: 108 mmol/L (ref 101–111)
GFR calc non Af Amer: >60 mL/min (ref >60)
Glucose, Bld: 101 mg/dL — ABNORMAL HIGH (ref 65–99)
Potassium: 4.1 mmol/L (ref 3.5–5.1)
SODIUM: 142 mmol/L (ref 135–145)

## 2015-09-22 LAB — MAGNESIUM: MAGNESIUM: 1.7 mg/dL (ref 1.7–2.4)

## 2015-09-22 LAB — GC/CHLAMYDIA PROBE AMP (~~LOC~~) NOT AT ARMC
Chlamydia: NEGATIVE
NEISSERIA GONORRHEA: NEGATIVE

## 2015-09-22 LAB — TSH: TSH: 3.93 u[IU]/mL (ref 0.350–4.500)

## 2015-09-22 LAB — HIV ANTIBODY (ROUTINE TESTING W REFLEX): HIV Screen 4th Generation wRfx: NONREACTIVE

## 2015-09-22 MED ORDER — SODIUM CHLORIDE 0.9 % IV SOLN
3.0000 g | Freq: Once | INTRAVENOUS | Status: DC
  Filled 2015-09-22: qty 3

## 2015-09-22 MED ORDER — DIPHENHYDRAMINE HCL 25 MG PO CAPS
25.0000 mg | ORAL_CAPSULE | Freq: Once | ORAL | Status: AC
  Administered 2015-09-22: 25 mg via ORAL
  Filled 2015-09-22: qty 1

## 2015-09-22 MED ORDER — ALUM & MAG HYDROXIDE-SIMETH 200-200-20 MG/5ML PO SUSP
30.0000 mL | Freq: Once | ORAL | Status: AC
  Administered 2015-09-22: 30 mL via ORAL
  Filled 2015-09-22: qty 30

## 2015-09-22 MED ORDER — PIPERACILLIN-TAZOBACTAM 3.375 G IVPB
3.3750 g | Freq: Three times a day (TID) | INTRAVENOUS | Status: DC
  Administered 2015-09-22 – 2015-09-24 (×6): 3.375 g via INTRAVENOUS
  Filled 2015-09-22 (×7): qty 50

## 2015-09-22 NOTE — Progress Notes (Signed)
PROGRESS NOTE  Kendra Becker L Reffner ZOX:[id number] DOB: 08/03/1965 DOA: 09/21/2015 PCP: [name],[name], [name]  HPI/Recap of past 24 hours:  Had hives with flagyl infusion,  Report abdominal pain better, but still significant rectal pain when trying to move her bowel  Assessment/Plan: Active Problems:   Diverticulitis  Diverticulitis with microperforation:  iv abx with cipro/flagyl, ivf, prn pain and antiemetics,  Patient developed hives with flagyl, will change abx to zosyn general surgery following, diet advancement per surgery.  Bacterial vaginosis:  Flagyl x2 doses with hives.   Smoking cessation education provided, nicotine patch  HTN: bp stable. hold oral bp med for now, prn hydralazine    DVT prophylaxis: lovenox   Consultants: general surgery  Code Status: full   Family Communication: Patient   Disposition Plan: home in a few days with general surgery clearnace   Procedures:  none  Antibiotics:  Cipro/falgyl on 5/7  Zosyn from 5/8   Objective: BP 112/66 mmHg  Pulse 81  Temp(Src) 98 F (36.7 C) (Oral)  Resp 20  Ht 5\' 8"  (1.727 m)  Wt 74.844 kg (165 lb)  BMI 25.09 kg/m2  SpO2 93% No intake or output data in the 24 hours ending 09/22/15 0744 Filed Weights   09/21/15 1358  Weight: 74.844 kg (165 lb)    Exam:   General:  NAD  Cardiovascular: RRR  Respiratory: CTABL  Abdomen:  Less tender, guarding has resolved, Soft, positive BS  Musculoskeletal: No Edema  Neuro: aaox3, anxious  Data Reviewed: Basic Metabolic Panel:  Recent Labs Lab 09/21/15 1435 09/22/15 0548  NA 141 142  K 3.8 4.1  CL 105 108  CO2 26 25  GLUCOSE 118* 101*  BUN 10 10  CREATININE 1.09* 0.92  CALCIUM 9.4 8.4*  MG  --  1.7   Liver Function Tests:  Recent Labs Lab 09/21/15 1435  AST 19  ALT 18  ALKPHOS 95  BILITOT 1.2  PROT 7.9  ALBUMIN 4.3    Recent Labs Lab 09/21/15 1435  LIPASE 17   No results for input(s): AMMONIA in the last 168  hours. CBC:  Recent Labs Lab 09/21/15 1435 09/21/15 1632 09/22/15 0548  WBC 14.5* 13.5* 8.0  NEUTROABS  --  9.3*  --   HGB 15.3* 15.3* 13.1  HCT 44.8 44.3 38.8  MCV 90.9 90.8 91.1  PLT 225 189 183   Cardiac Enzymes:   No results for input(s): CKTOTAL, CKMB, CKMBINDEX, TROPONINI in the last 168 hours. BNP (last 3 results) No results for input(s): BNP in the last 8760 hours.  ProBNP (last 3 results) No results for input(s): PROBNP in the last 8760 hours.  CBG: No results for input(s): GLUCAP in the last 168 hours.  Recent Results (from the past 240 hour(s))  Wet prep, genital     Status: Abnormal   Collection Time: 09/21/15  4:16 PM  Result Value Ref Range Status   Yeast Wet Prep HPF POC NONE SEEN NONE SEEN Final   Trich, Wet Prep NONE SEEN NONE SEEN Final   Clue Cells Wet Prep HPF POC PRESENT (A) NONE SEEN Final   WBC, Wet Prep HPF POC MODERATE (A) NONE SEEN Final   Sperm NONE SEEN  Final     Studies: Ct Abdomen Pelvis W Contrast  09/21/2015  CLINICAL DATA:  Lower abdominal pain. EXAM: CT ABDOMEN AND PELVIS WITH CONTRAST TECHNIQUE: Multidetector CT imaging of the abdomen and pelvis was performed using the standard protocol following bolus administration of intravenous contrast. CONTRAST:  ISOVUE-300 IOPAMIDOL (ISOVUE-300) INJECTION 61% COMPARISON:  None. FINDINGS: The lung bases are normal. No free air. A small amount of free fluid is seen in the pelvis. Patient status post cholecystectomy. There is resulting mild prominence of the intra and extrahepatic bile ducts. The liver and portal vein are normal. The spleen pancreas, and right adrenal gland are normal. Minimal thickening of the left adrenal gland is likely hyperplasia. Probable tiny cyst in the left kidney too small to characterize. No hydronephrosis or perinephric stranding. Atherosclerosis in the aorta without aneurysm or dissection. No adenopathy is identified on today's study. The stomach and small bowel are  normal. There is diverticulitis associated with the low sigmoid colon. There is wall thickening and significant pericolonic fat stranding surrounding 1 of the diverticula. There is an air bubble within the fat stranding which could represent a micro perforation. There is no abscess formation adjacent to the colon or in the wall of the colon. The remainder of the colon is unremarkable. The patient is status post appendectomy. No adenopathy seen in the pelvis. The uterus and adnexae are unremarkable. Diverticulitis as described above. The bladder is normal. Delayed images through the upper abdomen demonstrate no filling defects in the renal collecting systems. Visualized bones are normal. IMPRESSION: 1. Diverticulitis with pericolonic fat stranding and wall thickening. There is a focus of air within the fat stranding without free air elsewhere in the abdomen. This suggests a micro perforation without abscess formation. Recommend follow-up as clinically warranted. Electronically Signed   By: Gerome Sam III M.D   On: 09/21/2015 17:59    Scheduled Meds: . ciprofloxacin  400 mg Intravenous Q12H  . enoxaparin (LOVENOX) injection  40 mg Subcutaneous Q24H  . metronidazole  500 mg Intravenous Q8H  . nicotine  21 mg Transdermal Daily    Continuous Infusions: . 0.9 % NaCl with KCl 20 mEq / L 75 mL/hr at 09/21/15 2107     Time spent:  Dejae Bernet MD, PhD  [facility] Pager [phone]. If 7PM-7AM, please contact night-coverage at [email or web address], password [facility] 09/22/2015, 7:44 AM  LOS: 1 day

## 2015-09-22 NOTE — Progress Notes (Signed)
Nutrition Brief Note  RD consulted to provide diet education regarding diverticulitis, low fiber diet.  RD attempted to provide education to patient. Pt requests RD return at a later time. RD to follow-up 5/9 for education.  If other nutrition issues arise, please re-consult RD.   Tilda FrancoLindsey Abdulai Blaylock, MS, RD, LDN Pager: [phone] After Hours Pager: [phone]

## 2015-09-22 NOTE — Progress Notes (Signed)
Pharmacy Antibiotic Note  Kendra Becker is a 50 y.o. female admitted on 09/21/2015 for  diverticulitis with microperforation.  She was initially started on Cipro/Flagyl however has now developed RASH with Flagyl administration.  Pharmacy has been consulted for Zosyn dosing.  Plan: Zosyn 3.375g IV q8h (4 hour infusion). No dose adjustments anticipated.  Pharmacy will sign off.  Please re-consult if needed.  Height: 5\' 8"  (172.7 cm) Weight: 165 lb (74.844 kg) IBW/kg (Calculated) : 63.9  Temp (24hrs), Avg:98.1 F (36.7 C), Min:97.6 F (36.4 C), Max:98.5 F (36.9 C)   Recent Labs Lab 09/21/15 1435 09/21/15 1632 09/22/15 0548  WBC 14.5* 13.5* 8.0  CREATININE 1.09*  --  0.92    Estimated Creatinine Clearance: 74.6 mL/min (by C-G formula based on Cr of 0.92).    Allergies  Allergen Reactions  . Flagyl [Metronidazole] Itching    redness    Antimicrobials this admission: Rocephin 5/7 x1 dose Zithromax 5/7 x1 dose Flagyl 5/7>>5/8 (d/c'd due to rash) Cipro 5/7>>5/8 Zosyn 5/8>>  Dose adjustments this admission:  Microbiology results: 5/7 UCx: sent   Thank you for allowing pharmacy to be a part of this patient's care.  Junita PushMichelle Dulcemaria Bula, PharmD, BCPS Pager: [phone] 09/22/2015 10:15 AM

## 2015-09-22 NOTE — Progress Notes (Signed)
[facility] Progress Note     Subjective: Pt smokes 1-2 ppd, wants to quit.  No N/V, tolerating full liquid diet.  Ambulating well.  Much less pain today.  Had a good BM this am and is passing flatus.   Objective: Vital signs in last 24 hours: Temp:  [97.6 F (36.4 C)-98.5 F (36.9 C)] 98 F (36.7 C) (05/08 09810608) Pulse Rate:  [81-103] 81 (05/08 0608) Resp:  [15-20] 20 (05/08 0608) BP: (112-157)/(66-126) 112/66 mmHg (05/08 0608) SpO2:  [93 %-100 %] 93 % (05/08 0608) Weight:  [165 lb (74.844 kg)] 165 lb (74.844 kg) (05/07 1358) Last BM Date: 09/22/15  Intake/Output from previous day:   Intake/Output this shift:    PE: Gen:  Alert, NAD, pleasant Abd: Soft, mild distension and tenderness in LLQ, +BS, no HSM   Lab Results:   Recent Labs  09/21/15 1632 09/22/15 0548  WBC 13.5* 8.0  HGB 15.3* 13.1  HCT 44.3 38.8  PLT 189 183   BMET  Recent Labs  09/21/15 1435 09/22/15 0548  NA 141 142  K 3.8 4.1  CL 105 108  CO2 26 25  GLUCOSE 118* 101*  BUN 10 10  CREATININE 1.09* 0.92  CALCIUM 9.4 8.4*   PT/INR No results for input(s): LABPROT, INR in the last 72 hours. CMP     Component Value Date/Time   NA 142 09/22/2015 0548   K 4.1 09/22/2015 0548   CL 108 09/22/2015 0548   CO2 25 09/22/2015 0548   GLUCOSE 101* 09/22/2015 0548   BUN 10 09/22/2015 0548   CREATININE 0.92 09/22/2015 0548   CALCIUM 8.4* 09/22/2015 0548   PROT 7.9 09/21/2015 1435   ALBUMIN 4.3 09/21/2015 1435   AST 19 09/21/2015 1435   ALT 18 09/21/2015 1435   ALKPHOS 95 09/21/2015 1435   BILITOT 1.2 09/21/2015 1435   GFRNONAA >60 09/22/2015 0548   GFRAA >60 09/22/2015 0548   Lipase     Component Value Date/Time   LIPASE 17 09/21/2015 1435       Studies/Results: Ct Abdomen Pelvis W Contrast  09/21/2015  CLINICAL DATA:  Lower abdominal pain. EXAM: CT ABDOMEN AND PELVIS WITH CONTRAST TECHNIQUE: Multidetector CT imaging of the abdomen and pelvis was performed using the  standard protocol following bolus administration of intravenous contrast. CONTRAST:  100mL ISOVUE-300 IOPAMIDOL (ISOVUE-300) INJECTION 61% COMPARISON:  None. FINDINGS: The lung bases are normal. No free air. A small amount of free fluid is seen in the pelvis. Patient status post cholecystectomy. There is resulting mild prominence of the intra and extrahepatic bile ducts. The liver and portal vein are normal. The spleen pancreas, and right adrenal gland are normal. Minimal thickening of the left adrenal gland is likely hyperplasia. Probable tiny cyst in the left kidney too small to characterize. No hydronephrosis or perinephric stranding. Atherosclerosis in the aorta without aneurysm or dissection. No adenopathy is identified on today's study. The stomach and small bowel are normal. There is diverticulitis associated with the low sigmoid colon. There is wall thickening and significant pericolonic fat stranding surrounding 1 of the diverticula. There is an air bubble within the fat stranding which could represent a micro perforation. There is no abscess formation adjacent to the colon or in the wall of the colon. The remainder of the colon is unremarkable. The patient is status post appendectomy. No adenopathy seen in the pelvis. The uterus and adnexae are unremarkable. Diverticulitis as described above. The bladder is normal. Delayed images through the upper  abdomen demonstrate no filling defects in the renal collecting systems. Visualized bones are normal. IMPRESSION: 1. Diverticulitis with pericolonic fat stranding and wall thickening. There is a focus of air within the fat stranding without free air elsewhere in the abdomen. This suggests a micro perforation without abscess formation. Recommend follow-up as clinically warranted. Electronically Signed   By: Gerome Sam III M.D   On: 09/21/2015 17:59    Anti-infectives: Anti-infectives    Start     Dose/Rate Route Frequency Ordered Stop   09/22/15 1015   Ampicillin-Sulbactam (UNASYN) 3 g in sodium chloride 0.9 % 100 mL IVPB     3 g 100 mL/hr over 60 Minutes Intravenous  Once 09/22/15 1001     09/22/15 0600  ciprofloxacin (CIPRO) IVPB 400 mg  Status:  Discontinued     400 mg 200 mL/hr over 60 Minutes Intravenous Every 12 hours 09/21/15 1946 09/22/15 0929   09/22/15 0200  metroNIDAZOLE (FLAGYL) IVPB 500 mg  Status:  Discontinued     500 mg 100 mL/hr over 60 Minutes Intravenous Every 8 hours 09/21/15 1946 09/22/15 0929   09/21/15 1915  piperacillin-tazobactam (ZOSYN) IVPB 3.375 g     3.375 g 100 mL/hr over 30 Minutes Intravenous  Once 09/21/15 1912 09/21/15 2137   09/21/15 1845  piperacillin-tazobactam (ZOSYN) IVPB 4.5 g  Status:  Discontinued     4.5 g 200 mL/hr over 30 Minutes Intravenous  Once 09/21/15 1842 09/21/15 1912   09/21/15 1815  ciprofloxacin (CIPRO) IVPB 400 mg  Status:  Discontinued     400 mg 200 mL/hr over 60 Minutes Intravenous  Once 09/21/15 1808 09/21/15 1841   09/21/15 1815  metroNIDAZOLE (FLAGYL) IVPB 500 mg  Status:  Discontinued     500 mg 100 mL/hr over 60 Minutes Intravenous  Once 09/21/15 1808 09/21/15 1841   09/21/15 1630  cefTRIAXone (ROCEPHIN) injection 250 mg     250 mg Intramuscular  Once 09/21/15 1622 09/21/15 1703   09/21/15 1630  azithromycin (ZITHROMAX) tablet 1,000 mg     1,000 mg Oral  Once 09/21/15 1622 09/21/15 1703       Assessment/Plan Acute sigmoid diverticulitis without evidence of abscess.  -No indication for acute surgical intervention at this time. -WBC normal today, Broad-spectrum IV antibiotics (Switched to Zosyn since allergic to flagyl), Augmentin at discharge -Full liquids today (as tolerated), advance to soft tomorrow if doing well -When this is tolerated, could discharge him home antibiotics (usually 14 day course) with follow-up with primary care physician.  -Recommend colonoscopy 6-8 weeks after antibiotic cessation. -Ambulate and IS encouraged   Tobacco abuse - wanting to  quit smoking, tolerating  nicotine patch    LOS: 1 day    Nonie Hoyer 09/22/2015, 10:02 AM Pager: [phone]  (7am - 4:30pm M-F; 7am - 11:30am Sa/Su)

## 2015-09-22 NOTE — Progress Notes (Signed)
After administering IV Flagyl, patient started complaining of itching "all over", small rash to bilateral arms and legs. IV Flagyl stopped, continued IV maintenance fluids. Notified hospitalist on call to receive order for Benadryl, will continue to monitor.

## 2015-09-23 LAB — MAGNESIUM: MAGNESIUM: 1.8 mg/dL (ref 1.7–2.4)

## 2015-09-23 LAB — HEMOGLOBIN A1C
Hgb A1c MFr Bld: 5.7 % — ABNORMAL HIGH (ref 4.8–5.6)
MEAN PLASMA GLUCOSE: 117 mg/dL

## 2015-09-23 LAB — BASIC METABOLIC PANEL
ANION GAP: 6 (ref 5–15)
BUN: 6 mg/dL (ref 6–20)
CALCIUM: 8.8 mg/dL — AB (ref 8.9–10.3)
CHLORIDE: 110 mmol/L (ref 101–111)
CO2: 26 mmol/L (ref 22–32)
CREATININE: 0.88 mg/dL (ref 0.44–1.00)
GFR calc non Af Amer: >60 mL/min (ref >60)
Glucose, Bld: 86 mg/dL (ref 65–99)
Potassium: 4.2 mmol/L (ref 3.5–5.1)
SODIUM: 142 mmol/L (ref 135–145)

## 2015-09-23 LAB — CBC
HEMATOCRIT: 38.5 % (ref 36.0–46.0)
HEMOGLOBIN: 12.6 g/dL (ref 12.0–15.0)
MCH: 30.5 pg (ref 26.0–34.0)
MCHC: 32.7 g/dL (ref 30.0–36.0)
MCV: 93.2 fL (ref 78.0–100.0)
Platelets: 199 10*3/uL (ref 150–400)
RBC: 4.13 MIL/uL (ref 3.87–5.11)
RDW: 13.5 % (ref 11.5–15.5)
WBC: 5.4 10*3/uL (ref 4.0–10.5)

## 2015-09-23 LAB — URINE CULTURE

## 2015-09-23 MED ORDER — GI COCKTAIL ~~LOC~~
30.0000 mL | Freq: Once | ORAL | Status: AC
  Administered 2015-09-23: 30 mL via ORAL
  Filled 2015-09-23: qty 30

## 2015-09-23 NOTE — Progress Notes (Signed)
Patient ID: Kendra Becker, female   DOB: 04/28/1966, 50 y.o.   MRN: [id number]  General Surgery [facility]  Subjective: Patient tolerating liquid diet, mild intermittent pain, loose BM's.  No nausea or emesis.  Objective: Vital signs in last 24 hours: Temp:  [98.1 F (36.7 C)-98.7 F (37.1 C)] 98.5 F (36.9 C) (05/09 0507) Pulse Rate:  [80-88] 80 (05/09 0507) Resp:  [16-20] 16 (05/09 0507) BP: (111-144)/(79-90) 124/90 mmHg (05/09 0507) SpO2:  [95 %-97 %] 95 % (05/09 0507) Last BM Date: 09/22/15  Intake/Output from previous day: 05/08 0701 - 05/09 0700 In: 870 [P.O.:720; IV Piggyback:150] Out: -  Intake/Output this shift:    Physical Exam: HEENT - sclerae clear, mucous membranes moist Abdomen - soft, mild tenderness LLQ, no mass Ext - no edema, non-tender Neuro - alert & oriented, no focal deficits  Lab Results:   Recent Labs  09/22/15 0548 09/23/15 0550  WBC 8.0 5.4  HGB 13.1 12.6  HCT 38.8 38.5  PLT 183 199   BMET  Recent Labs  09/22/15 0548 09/23/15 0550  NA 142 142  K 4.1 4.2  CL 108 110  CO2 25 26  GLUCOSE 101* 86  BUN 10 6  CREATININE 0.92 0.88  CALCIUM 8.4* 8.8*   PT/INR No results for input(s): LABPROT, INR in the last 72 hours. Comprehensive Metabolic Panel:    Component Value Date/Time   NA 142 09/23/2015 0550   NA 142 09/22/2015 0548   K 4.2 09/23/2015 0550   K 4.1 09/22/2015 0548   CL 110 09/23/2015 0550   CL 108 09/22/2015 0548   CO2 26 09/23/2015 0550   CO2 25 09/22/2015 0548   BUN 6 09/23/2015 0550   BUN 10 09/22/2015 0548   CREATININE 0.88 09/23/2015 0550   CREATININE 0.92 09/22/2015 0548   GLUCOSE 86 09/23/2015 0550   GLUCOSE 101* 09/22/2015 0548   CALCIUM 8.8* 09/23/2015 0550   CALCIUM 8.4* 09/22/2015 0548   AST 19 09/21/2015 1435   ALT 18 09/21/2015 1435   ALKPHOS 95 09/21/2015 1435   BILITOT 1.2 09/21/2015 1435   PROT 7.9 09/21/2015 1435   ALBUMIN 4.3 09/21/2015 1435    Studies/Results: Ct  Abdomen Pelvis W Contrast  09/21/2015  CLINICAL DATA:  Lower abdominal pain. EXAM: CT ABDOMEN AND PELVIS WITH CONTRAST TECHNIQUE: Multidetector CT imaging of the abdomen and pelvis was performed using the standard protocol following bolus administration of intravenous contrast. CONTRAST:  100mL ISOVUE-300 IOPAMIDOL (ISOVUE-300) INJECTION 61% COMPARISON:  None. FINDINGS: The lung bases are normal. No free air. A small amount of free fluid is seen in the pelvis. Patient status post cholecystectomy. There is resulting mild prominence of the intra and extrahepatic bile ducts. The liver and portal vein are normal. The spleen pancreas, and right adrenal gland are normal. Minimal thickening of the left adrenal gland is likely hyperplasia. Probable tiny cyst in the left kidney too small to characterize. No hydronephrosis or perinephric stranding. Atherosclerosis in the aorta without aneurysm or dissection. No adenopathy is identified on today's study. The stomach and small bowel are normal. There is diverticulitis associated with the low sigmoid colon. There is wall thickening and significant pericolonic fat stranding surrounding 1 of the diverticula. There is an air bubble within the fat stranding which could represent a micro perforation. There is no abscess formation adjacent to the colon or in the wall of the colon. The remainder of the colon is unremarkable. The patient is status post appendectomy.  No adenopathy seen in the pelvis. The uterus and adnexae are unremarkable. Diverticulitis as described above. The bladder is normal. Delayed images through the upper abdomen demonstrate no filling defects in the renal collecting systems. Visualized bones are normal. IMPRESSION: 1. Diverticulitis with pericolonic fat stranding and wall thickening. There is a focus of air within the fat stranding without free air elsewhere in the abdomen. This suggests a micro perforation without abscess formation. Recommend follow-up as  clinically warranted. Electronically Signed   By: Gerome Sam III M.D   On: 09/21/2015 17:59    Assessment & Plans: Acute diverticulitis with microperforation on CT scan  IV Zosyn  OK to advance to low residue solid diet  Will need additional 10 day course of oral abx on discharge  Will follow with you - no indication for surgical intervention at this time  Velora Heckler, MD, [facility] Office: [phone]   Kendra Becker 09/23/2015

## 2015-09-23 NOTE — Progress Notes (Signed)
PROGRESS NOTE  Kendra Becker WUJ:[id number] DOB: 10/02/1965 DOA: 09/21/2015 PCP: Kendra Becker  HPI/Recap of past 24 hours:  Doing better, less abdominal pain, tolerating full liquid diet, no n/v, no fever, ambulating    Assessment/Plan: Active Problems:   Diverticulitis  Diverticulitis with microperforation:  Was on cipro/flagyl initially , changed to IV zosyn due to allergic reaction to flagyl ( hives), ivf, prn pain and antiemetics,  general surgery following, diet advancement per surgery.  Bacterial vaginosis:  Flagyl x2 doses with hives.   Smoking cessation education provided, nicotine patch  HTN: bp stable. Home bp med held since admission, likely able to resume at discharge.     DVT prophylaxis: lovenox   Consultants: general surgery  Code Status: full   Family Communication: Patient   Disposition Plan: home likely 5/10 with general surgery [place]   Procedures:  none  Antibiotics:  Cipro/falgyl on 5/7  Zosyn from 5/8   Objective: BP 124/90 mmHg  Pulse 80  Temp(Src) 98.5 F (36.9 C) (Oral)  Resp 16  Ht 5\' 8"  (1.727 m)  Wt 74.844 kg (165 lb)  BMI 25.09 kg/m2  SpO2 95%  Intake/Output Summary (Last 24 hours) at 09/23/15 1318 Last data filed at 09/23/15 0600  Gross per 24 hour  Intake    630 ml  Output      0 ml  Net    630 ml   Filed Weights   09/21/15 1358  Weight: 74.844 kg (165 lb)    Exam:   General:  NAD  Cardiovascular: RRR  Respiratory: CTABL  Abdomen:  Less tender, guarding has resolved, Soft, positive BS  Musculoskeletal: No Edema  Neuro: aaox3, anxious  Data Reviewed: Basic Metabolic Panel:  Recent Labs Lab 09/21/15 1435 09/22/15 0548 09/23/15 0550  NA 141 142 142  K 3.8 4.1 4.2  CL 105 108 110  CO2 26 25 26   GLUCOSE 118* 101* 86  BUN 10 10 6   CREATININE 1.09* 0.92 0.88  CALCIUM 9.4 8.4* 8.8*  MG  --  1.7 1.8   Liver Function Tests:  Recent Labs Lab 09/21/15 1435  AST 19  ALT 18   ALKPHOS 95  BILITOT 1.2  PROT 7.9  ALBUMIN 4.3    Recent Labs Lab 09/21/15 1435  LIPASE 17   No results for input(s): AMMONIA in the last 168 hours. CBC:  Recent Labs Lab 09/21/15 1435 09/21/15 1632 09/22/15 0548 09/23/15 0550  WBC 14.5* 13.5* 8.0 5.4  NEUTROABS  --  9.3*  --   --   HGB 15.3* 15.3* 13.1 12.6  HCT 44.8 44.3 38.8 38.5  MCV 90.9 90.8 91.1 93.2  PLT 225 189 183 199   Cardiac Enzymes:   No results for input(s): CKTOTAL, CKMB, CKMBINDEX, TROPONINI in the last 168 hours. BNP (last 3 results) No results for input(s): BNP in the last 8760 hours.  ProBNP (last 3 results) No results for input(s): PROBNP in the last 8760 hours.  CBG: No results for input(s): GLUCAP in the last 168 hours.  Recent Results (from the past 240 hour(s))  Urine culture     Status: Abnormal   Collection Time: 09/21/15  4:06 PM  Result Value Ref Range Status   Specimen Description URINE, CLEAN CATCH  Final   Special Requests NONE  Final   Culture MULTIPLE SPECIES PRESENT, SUGGEST RECOLLECTION (A)  Final   Report Status 09/23/2015 FINAL  Final  Wet prep, genital     Status: Abnormal   Collection Time:  09/21/15  4:16 PM  Result Value Ref Range Status   Yeast Wet Prep HPF POC NONE SEEN NONE SEEN Final   Trich, Wet Prep NONE SEEN NONE SEEN Final   Clue Cells Wet Prep HPF POC PRESENT (A) NONE SEEN Final   WBC, Wet Prep HPF POC MODERATE (A) NONE SEEN Final   Sperm NONE SEEN  Final     Studies: No results found.  Scheduled Meds: . enoxaparin (LOVENOX) injection  40 mg Subcutaneous Q24H  . nicotine  21 mg Transdermal Daily  . piperacillin-tazobactam (ZOSYN)  IV  3.375 g Intravenous Q8H    Continuous Infusions: . 0.9 % NaCl with KCl 20 mEq / L 75 mL/hr at 09/22/15 0946     Time spent:  Calen Posch MD, PhD  [facility] Pager [phone]. If 7PM-7AM, please contact night-coverage at [email or web address], password [facility] 09/23/2015, 1:18 PM  LOS: 2 days

## 2015-09-23 NOTE — Plan of Care (Signed)
Problem: Food- and Nutrition-Related Knowledge Deficit (NB-1.1) Goal: Nutrition education Formal process to instruct or train a patient/client in a skill or to impart knowledge to help patients/clients voluntarily manage or modify food choices and eating behavior to maintain or improve health. Outcome: Completed/Met Date Met:  09/23/15 Nutrition Education Note  RD consulted for nutrition education regarding a Low fiber diet.  RD provided "Fiber Restricted Nutrition Therapy" handout from the Academy of [employer]etics. Reviewed patient's dietary recall. Provided examples on low and high fiber foods. Encouraged intake of low fiber foods. Discouraged high fiber foods, caffeine and red meat intake. Reviewed benefits of pre- and probiotic intake. Teach back method used.  Expect good compliance. Pt had many questions during education and was appreciative of visit.  Body mass index is 25.09 kg/(m^2). Pt meets criteria for overweight based on current BMI.  Current diet order is Full liquid, patient is consuming approximately 100% of meals at this time. Per chart review, plan is for pt to advance to soft diet today. Labs and medications reviewed. No further nutrition interventions warranted at this time. If additional nutrition issues arise, please re-consult RD.  Clayton Bibles, MS, RD, LDN Pager: [phone] After Hours Pager: [phone]

## 2015-09-24 DIAGNOSIS — K5720 Diverticulitis of large intestine with perforation and abscess without bleeding: Principal | ICD-10-CM

## 2015-09-24 LAB — BASIC METABOLIC PANEL
ANION GAP: 11 (ref 5–15)
BUN: 7 mg/dL (ref 6–20)
CHLORIDE: 111 mmol/L (ref 101–111)
CO2: 21 mmol/L — AB (ref 22–32)
Calcium: 9.1 mg/dL (ref 8.9–10.3)
Creatinine, Ser: 0.75 mg/dL (ref 0.44–1.00)
GFR calc Af Amer: >60 mL/min (ref >60)
GFR calc non Af Amer: >60 mL/min (ref >60)
GLUCOSE: 84 mg/dL (ref 65–99)
POTASSIUM: 4.3 mmol/L (ref 3.5–5.1)
Sodium: 143 mmol/L (ref 135–145)

## 2015-09-24 MED ORDER — HYDROCODONE-ACETAMINOPHEN 5-325 MG PO TABS: 2.0000 | ORAL_TABLET | Freq: Four times a day (QID) | ORAL | Status: DC | PRN

## 2015-09-24 MED ORDER — AMOXICILLIN-POT CLAVULANATE 875-125 MG PO TABS: 1.0000 | ORAL_TABLET | Freq: Two times a day (BID) | ORAL | Status: DC

## 2015-09-24 NOTE — Discharge Summary (Signed)
Physician Discharge Summary  Kendra Becker ZOX:[id number] DOB: 09/21/1965 DOA: 09/21/2015  PCP: Lilia Argue  Admit date: 09/21/2015 Discharge date: 09/24/2015  Time spent: 35 minutes  Recommendations for Outpatient Follow-up:  Needs referral to GI for evaluation for colonoscopy after episode of diverticulitis.   Discharge Diagnoses:    Diverticulitis   Discharge Condition: stable  Diet recommendation: low residue diet   Filed Weights   09/21/15 1358  Weight: 74.844 kg (165 lb)    History of present illness:  Kendra Becker is a 50 y.o. 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   ED course: her vital is stable, CT ab showed diverticulitis with microperforation. Wet prep with clue cells, labs, wbc 14.5, cr 1.09, otherwise unremarkable, she is given abx in the ED, EDP consulted general surgery, hospitalist call to admit the patient.  Hospital Course:  Diverticulitis with microperforation:  Was on cipro/flagyl initially , changed to IV zosyn due to allergic reaction to flagyl ( hives), ivf, prn pain and antiemetics,  general surgery following, tolerating diet, pain has improved. Ok to discharge home today. Will provide prescription for Augmentin for 10 days. She will need fu with GI   Bacterial vaginosis: Flagyl x2 doses with hives.   Smoking cessation education provided, nicotine patch  HTN: bp stable. Home bp med held since admission, likely able to resume at discharge.  Procedures:  none  Consultations:  Surgery   Discharge Exam: Filed Vitals:   09/23/15 2329 09/24/15 0522  BP: 137/88 136/92  Pulse: 66 64  Temp:  97.4 F (36.3 C)  Resp:  16    General:  NAD Cardiovascular: S 1, S 2 RRR Respiratory: CTA  Discharge Instructions   Discharge Instructions    Increase activity slowly    Complete by:  As directed           Current Discharge Medication List    START taking these medications   Details  amoxicillin-clavulanate (AUGMENTIN) 875-125 MG tablet Take 1 tablet by mouth 2 (two) times daily. Qty: 20 tablet, Refills: 0      CONTINUE these medications which have CHANGED   Details  HYDROcodone-acetaminophen (NORCO/VICODIN) 5-325 MG tablet Take 2 tablets by mouth every 6 (six) hours as needed. Qty: 10 tablet, Refills: 0      CONTINUE these medications which have NOT CHANGED   Details  ALPRAZolam (XANAX) 0.5 MG tablet Take 0.25 mg by mouth 2 (two) times daily as needed. Reported on 09/21/2015    lisinopril (PRINIVIL,ZESTRIL) 20 MG tablet Take 20 mg by mouth daily. Reported on 09/21/2015      STOP taking these medications     cephALEXin (KEFLEX) 250 MG capsule        Allergies  Allergen Reactions  . Flagyl [Metronidazole] Itching    redness   Follow-up Information    Follow up with KAPLAN,[name], [name] In 1 week.   Specialty:  Family Medicine   Why:  hospital discharge follow up, refer to gastroenterology for colonoscopy in 8 weeks.   Contact information:   [address] [place] [address] [phone]        The results of significant diagnostics from this hospitalization (including imaging, microbiology, ancillary and laboratory) are listed below for  reference.    Significant Diagnostic Studies: Ct Abdomen Pelvis W Contrast  09/21/2015  CLINICAL DATA:  Lower abdominal pain. EXAM: CT ABDOMEN AND PELVIS WITH CONTRAST TECHNIQUE: Multidetector CT imaging of the abdomen and pelvis was performed using the standard protocol following bolus administration of intravenous contrast. CONTRAST:  ISOVUE-300 IOPAMIDOL (ISOVUE-300) INJECTION 61% COMPARISON:  None. FINDINGS: The lung bases are normal. No free air. A  small amount of free fluid is seen in the pelvis. Patient status post cholecystectomy. There is resulting mild prominence of the intra and extrahepatic bile ducts. The liver and portal vein are normal. The spleen pancreas, and right adrenal gland are normal. Minimal thickening of the left adrenal gland is likely hyperplasia. Probable tiny cyst in the left kidney too small to characterize. No hydronephrosis or perinephric stranding. Atherosclerosis in the aorta without aneurysm or dissection. No adenopathy is identified on today's study. The stomach and small bowel are normal. There is diverticulitis associated with the low sigmoid colon. There is wall thickening and significant pericolonic fat stranding surrounding 1 of the diverticula. There is an air bubble within the fat stranding which could represent a micro perforation. There is no abscess formation adjacent to the colon or in the wall of the colon. The remainder of the colon is unremarkable. The patient is status post appendectomy. No adenopathy seen in the pelvis. The uterus and adnexae are unremarkable. Diverticulitis as described above. The bladder is normal. Delayed images through the upper abdomen demonstrate no filling defects in the renal collecting systems. Visualized bones are normal. IMPRESSION: 1. Diverticulitis with pericolonic fat stranding and wall thickening. There is a focus of air within the fat stranding without free air elsewhere in the abdomen. This suggests a micro perforation without abscess formation. Recommend follow-up as clinically warranted. Electronically Signed   By: Gerome Sam III M.D   On: 09/21/2015 17:59    Microbiology: Recent Results (from the past 240 hour(s))  Urine culture     Status: Abnormal   Collection Time: 09/21/15  4:06 PM  Result Value Ref Range Status   Specimen Description URINE, CLEAN CATCH  Final   Special Requests NONE  Final   Culture MULTIPLE SPECIES PRESENT, SUGGEST RECOLLECTION (A)  Final    Report Status 09/23/2015 FINAL  Final  Wet prep, genital     Status: Abnormal   Collection Time: 09/21/15  4:16 PM  Result Value Ref Range Status   Yeast Wet Prep HPF POC NONE SEEN NONE SEEN Final   Trich, Wet Prep NONE SEEN NONE SEEN Final   Clue Cells Wet Prep HPF POC PRESENT (A) NONE SEEN Final   WBC, Wet Prep HPF POC MODERATE (A) NONE SEEN Final   Sperm NONE SEEN  Final     Labs: Basic Metabolic Panel:  Recent Labs Lab 09/21/15 1435 09/22/15 0548 09/23/15 0550 09/24/15 0537  NA 141 142 142 143  K 3.8 4.1 4.2 4.3  CL 105 108 110 111  CO2 21*  GLUCOSE 118* 101* 86 84  BUN CREATININE 1.09* 0.92 0.88 0.75  CALCIUM 9.4 8.4* 8.8* 9.1  MG  --  1.7 1.8  --    Liver Function Tests:  Recent Labs Lab 09/21/15 1435  AST 19  ALT 18  ALKPHOS 95  BILITOT 1.2  PROT 7.9  ALBUMIN 4.3    Recent Labs Lab 09/21/15 1435  LIPASE 17   No results for input(s): AMMONIA in the last 168 hours.  CBC:  Recent Labs Lab 09/21/15 1435 09/21/15 1632 09/22/15 0548 09/23/15 0550  WBC 14.5* 13.5* 8.0 5.4  NEUTROABS  --  9.3*  --   --   HGB 15.3* 15.3* 13.1 12.6  HCT 44.8 44.3 38.8 38.5  MCV 90.9 90.8 91.1 93.2  PLT 225 189 183 199   Cardiac Enzymes: No results for input(s): CKTOTAL, CKMB, CKMBINDEX, TROPONINI in the last 168 hours. BNP: BNP (last 3 results) No results for input(s): BNP in the last 8760 hours.  ProBNP (last 3 results) No results for input(s): PROBNP in the last 8760 hours.  CBG: No results for input(s): GLUCAP in the last 168 hours.     Signed:  Alba Coryegalado, Tashiba Timoney A MD.  [facility] 09/24/2015, 11:59 AM

## 2015-09-24 NOTE — Care Management Note (Signed)
Case Management Note  Patient Details  Name: Kendra Becker MRN: [id number] Date of Birth: 11/07/1965  Subjective/Objective:         diverticulitis           Action/Plan:Date:  Sep 24, 2015 Chart reviewed for concurrent status and case management needs. Will continue to follow patient for changes and needs: Expected discharge date: [name], BSOllen Gross, RN3, [place]   [phone]   Expected Discharge Date:    [id number]              Expected Discharge Plan:  Home/Self Care  In-House Referral:  NA  Discharge planning Services  CM Consult  Post Acute Care Choice:  NA Choice offered to:  NA  DME Arranged:  N/A DME Agency:  NA  HH Arranged:  NA HH Agency:  NA  Status of Service:  Completed, signed off  Medicare Important Message Given:    Date Medicare IM Given:    Medicare IM give by:    Date Additional Medicare IM Given:    Additional Medicare Important Message give by:     If discussed at Long Length of Stay Meetings, dates discussed:    Additional Comments:  Golda AcreDavis, Celita Aron Lynn, RN 09/24/2015, 1:49 PM

## 2015-09-24 NOTE — Progress Notes (Signed)
  Subjective: She looks fine, pain much improved still fairly sore.  Distressed over some mucus like stool with some blood last PM, but tolerating diet this AM.    Objective: Vital signs in last 24 hours: Temp:  [97.3 F (36.3 C)-98.5 F (36.9 C)] 97.4 F (36.3 C) (05/10 0522) Pulse Rate:  [64-73] 64 (05/10 0522) Resp:  [16-20] 16 (05/10 0522) BP: (136-173)/(87-124) 136/92 mmHg (05/10 0522) SpO2:  [98 %-99 %] 98 % (05/10 0522) Last BM Date: 09/23/15 720 PO Voided x 3 Afebrile, VSS  BMP Ok Intake/Output from previous day: 05/09 0701 - 05/10 0700 In: 1165 [P.O.:720; I.V.:395; IV Piggyback:50] Out: -  Intake/Output this shift:    General appearance: alert, cooperative and no distress GI: soft, non-tender, but still sore,  bowel sounds normal; no masses,  no organomegaly  Lab Results:   Recent Labs  09/22/15 0548 09/23/15 0550  WBC 8.0 5.4  HGB 13.1 12.6  HCT 38.8 38.5  PLT 183 199    BMET  Recent Labs  09/23/15 0550 09/24/15 0537  NA 142 143  K 4.2 4.3  CL 110 111  CO2 26 21*  GLUCOSE 86 84  BUN 6 7  CREATININE 0.88 0.75  CALCIUM 8.8* 9.1   PT/INR No results for input(s): LABPROT, INR in the last 72 hours.   Recent Labs Lab 09/21/15 1435  AST 19  ALT 18  ALKPHOS 95  BILITOT 1.2  PROT 7.9  ALBUMIN 4.3     Lipase     Component Value Date/Time   LIPASE 17 09/21/2015 1435     Studies/Results: No results found.  Medications: . enoxaparin (LOVENOX) injection  40 mg Subcutaneous Q24H  . nicotine  21 mg Transdermal Daily  . piperacillin-tazobactam (ZOSYN)  IV  3.375 g Intravenous Q8H   . 0.9 % NaCl with KCl 20 mEq / L 75 mL/hr at 09/24/15 0502   Assessment/Plan Sigmoid diverticulitis with microperforation Tobacco use Bacterial vaginosis Hypertension FEN:  Soft diet/IV fluids ID:  Day 4 zosyn  DVT:  Lovenox    Plan:  Medical management.  She can follow up with PCP after discharge.     LOS: 3 days     Usha Slager 09/24/2015 [phone]

## 2016-03-24 ENCOUNTER — Ambulatory Visit: Payer: Self-pay | Admitting: General Surgery

## 2016-03-24 NOTE — H&P (Signed)
Kendra Becker 03/24/2016 9:24 AM Location: [facility] Patient #: [id number] DOB: 08/07/1965 Separated / Language: [place] / Race: White Female  History of Present Illness Adolph Pollack(Consuela Widener J. Blaike Vickers MD; 03/24/2016 10:03 AM) The patient is a 50 year old female.   Note:She is referred by Dr. Loreta AveMann because of a concern for anal warts. She is noted of a number of bumps around her anus which had been increasing in number over time. They do not itch or bleed. She's had a history of skin warts on her legs in the past.  Other Problems Gilmer Mor(Sonya Bynum, CMA; 03/24/2016 9:25 AM) Anxiety Disorder Asthma Depression Hemorrhoids High blood pressure  Past Surgical History Gilmer Mor(Sonya Bynum, CMA; 03/24/2016 9:25 AM) Aneurysm Repair Appendectomy Gallbladder Surgery - Laparoscopic  Diagnostic Studies History Gilmer Mor(Sonya Bynum, CMA; 03/24/2016 9:25 AM) Colonoscopy within last year Mammogram never Pap Smear 1-5 years ago  Allergies Lamar Laundry(Sonya Bynum, CMA; 03/24/2016 9:25 AM) Flagyl *ANTI-INFECTIVE AGENTS - MISC.*  Medication History Gilmer Mor(Sonya Bynum, CMA; 03/24/2016 9:26 AM) Lisinopril (30MG  Tablet, Oral) Active. ALPRAZolam (0.5MG  Tablet, Oral) Active. Lexapro (20MG  Tablet, Oral) Active. Medications Reconciled  Social History Gilmer Mor(Sonya Bynum, CMA; 03/24/2016 9:25 AM) Alcohol use Occasional alcohol use. Caffeine use Carbonated beverages, Coffee. No drug use Tobacco use Current every day smoker.  Family History Gilmer Mor(Sonya Bynum, CMA; 03/24/2016 9:25 AM) Alcohol Abuse Brother, Daughter, Father. Arthritis Mother. Colon Polyps Son. Depression Brother, Daughter, Mother, Son. Heart Disease Daughter. Hypertension Brother, Father, Mother. Respiratory Condition Daughter.  Pregnancy / Birth History Gilmer Mor(Sonya Bynum, CMA; 03/24/2016 9:25 AM) Age at menarche 15 years. Age of menopause <46 Contraceptive History Oral contraceptives. Gravida 6 Irregular periods Maternal age 49-20 Para  4     Review of Systems Lamar Laundry(Sonya Bynum CMA; 03/24/2016 9:25 AM) General Present- Fatigue. Not Present- Appetite Loss, Chills, Fever, Night Sweats, Weight Gain and Weight Loss. Skin Present- Change in Wart/Mole. Not Present- Dryness, Hives, Jaundice, New Lesions, Non-Healing Wounds, Rash and Ulcer. HEENT Present- Seasonal Allergies, Sinus Pain, Sore Throat and Wears glasses/contact lenses. Not Present- Earache, Hearing Loss, Hoarseness, Nose Bleed, Oral Ulcers, Ringing in the Ears, Visual Disturbances and Yellow Eyes. Respiratory Present- Wheezing. Not Present- Bloody sputum, Chronic Cough, Difficulty Breathing and Snoring. Cardiovascular Not Present- Chest Pain, Difficulty Breathing Lying Down, Leg Cramps, Palpitations, Rapid Heart Rate, Shortness of Breath and Swelling of Extremities. Gastrointestinal Not Present- Abdominal Pain, Bloating, Bloody Stool, Change in Bowel Habits, Chronic diarrhea, Constipation, Difficulty Swallowing, Excessive gas, Gets full quickly at meals, Hemorrhoids, Indigestion, Nausea, Rectal Pain and Vomiting. Neurological Present- Headaches. Not Present- Decreased Memory, Fainting, Numbness, Seizures, Tingling, Tremor, Trouble walking and Weakness. Psychiatric Present- Anxiety. Not Present- Bipolar, Change in Sleep Pattern, Depression, Fearful and Frequent crying. Endocrine Present- Cold Intolerance and Hot flashes. Not Present- Excessive Hunger, Hair Changes, Heat Intolerance and New Diabetes. Hematology Not Present- Blood Thinners, Easy Bruising, Excessive bleeding, Gland problems, HIV and Persistent Infections.  Vitals (Sonya Bynum CMA; 03/24/2016 9:25 AM) 03/24/2016 9:25 AM Weight: 158 lb Height: 64in Body Surface Area: 1.77 m Body Mass Index: 27.12 kg/m  Pulse: 72 (Regular)  BP: 128/80 (Sitting, Left Arm, Standard)      Physical Exam Adolph Pollack(Adalae Baysinger J. Harmonie Verrastro MD; 03/24/2016 10:08 AM)  The physical exam findings are as follows: Note:General: WDWN in NAD.  Pleasant and cooperative.  HEENT: Right facial scar. No facial masses.  EYES: no scleral icterus,  CV: RRR, no murmur, no edema  CHEST: Breath sounds equal and clear. Respirations nonlabored.  ANORECTAL: Multiple small perianal skin lesion suspicious for condyloma on  visual inspection. On digital rectal exam, there are no palpable masses or irregularity. No blood is present.  SKIN: Small, raised lesions on her lower legs suspicious for skin warts.  NEUROLOGIC: Alert and oriented, answers questions appropriately.  PSYCHIATRIC: Normal mood, affect , and behavior.    Assessment & Plan Adolph Pollack(Mizuki Hoel J. 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  Plan: I recommended biopsy and laser ablation as an outpatient procedure. We went over the procedure, rationale, and risks. Risks include but are not limited to bleeding, infection, wound healing problem is, anesthesia. We also talked about the potential for recurrence. She seems to understand this and would like to proceed. I've asked her to try to stop smoking as much as possible as this could complicate wound healing and increase the infection rate.  Current Plans Follow up as needed Schedule for Surgery Pt Education - [facility] Free Text Education/Instructions: discussed with patient and provided information. VIRAL WARTS, UNSPECIFIED TYPE (B07.9) Impression: She appears to have some of these on both lower legs.  Plan: I recommended she get an appointment with her dermatologist for evaluation give her her past history of leg warts.  Avel Peaceodd Marris Frontera, M.D.

## 2016-05-12 ENCOUNTER — Encounter (HOSPITAL_BASED_OUTPATIENT_CLINIC_OR_DEPARTMENT_OTHER): Payer: Self-pay | Admitting: *Deleted

## 2016-05-12 NOTE — Progress Notes (Signed)
NPO AFTER MN.  ARRIVE AT 0930.  NEEDS ISTAT AND EKG.  WILL DO HIBICLENS SHOWER HS BEFORE AND AM DOS.  

## 2016-05-20 ENCOUNTER — Ambulatory Visit (HOSPITAL_BASED_OUTPATIENT_CLINIC_OR_DEPARTMENT_OTHER): Payer: Managed Care, Other (non HMO) | Admitting: Anesthesiology

## 2016-05-20 ENCOUNTER — Encounter (HOSPITAL_BASED_OUTPATIENT_CLINIC_OR_DEPARTMENT_OTHER): Payer: Self-pay | Admitting: *Deleted

## 2016-05-20 ENCOUNTER — Ambulatory Visit (HOSPITAL_BASED_OUTPATIENT_CLINIC_OR_DEPARTMENT_OTHER)
Admission: RE | Admit: 2016-05-20 | Discharge: 2016-05-20 | Disposition: A | Discharge status: Home / Self Care | Payer: Managed Care, Other (non HMO) | Source: Ambulatory Visit | Attending: General Surgery | Admitting: General Surgery

## 2016-05-20 ENCOUNTER — Encounter (HOSPITAL_BASED_OUTPATIENT_CLINIC_OR_DEPARTMENT_OTHER)
Admission: RE | Disposition: A | Discharge status: Home / Self Care | Payer: Self-pay | Source: Ambulatory Visit | Attending: General Surgery

## 2016-05-20 DIAGNOSIS — J45909 Unspecified asthma, uncomplicated: Secondary | ICD-10-CM | POA: Insufficient documentation

## 2016-05-20 DIAGNOSIS — F17200 Nicotine dependence, unspecified, uncomplicated: Secondary | ICD-10-CM | POA: Diagnosis not present

## 2016-05-20 DIAGNOSIS — F329 Major depressive disorder, single episode, unspecified: Secondary | ICD-10-CM | POA: Insufficient documentation

## 2016-05-20 DIAGNOSIS — A630 Anogenital (venereal) warts: Secondary | ICD-10-CM | POA: Insufficient documentation

## 2016-05-20 DIAGNOSIS — I10 Essential (primary) hypertension: Secondary | ICD-10-CM | POA: Diagnosis not present

## 2016-05-20 DIAGNOSIS — Z881 Allergy status to other antibiotic agents status: Secondary | ICD-10-CM | POA: Diagnosis not present

## 2016-05-20 DIAGNOSIS — Z79899 Other long term (current) drug therapy: Secondary | ICD-10-CM | POA: Insufficient documentation

## 2016-05-20 DIAGNOSIS — F419 Anxiety disorder, unspecified: Secondary | ICD-10-CM | POA: Insufficient documentation

## 2016-05-20 HISTORY — PX: LASER ABLATION CONDOLAMATA: SHX5941

## 2016-05-20 HISTORY — DX: Personal history of other diseases of the digestive system: Z87.19

## 2016-05-20 HISTORY — DX: Other specified postprocedural states: Z98.890

## 2016-05-20 HISTORY — DX: Anogenital (venereal) warts: A63.0

## 2016-05-20 HISTORY — DX: Personal history of other diseases of the circulatory system: Z86.79

## 2016-05-20 HISTORY — DX: Presence of spectacles and contact lenses: Z97.3

## 2016-05-20 LAB — POCT I-STAT 4, (NA,K, GLUC, HGB,HCT)
GLUCOSE: 86 mg/dL (ref 65–99)
HEMATOCRIT: 45 % (ref 36.0–46.0)
HEMOGLOBIN: 15.3 g/dL — AB (ref 12.0–15.0)
POTASSIUM: 4.3 mmol/L (ref 3.5–5.1)
Sodium: 142 mmol/L (ref 135–145)

## 2016-05-20 SURGERY — ABLATION, CONDYLOMA, USING LASER
Anesthesia: General | Site: Anus

## 2016-05-20 MED ORDER — ONDANSETRON HCL 4 MG/2ML IJ SOLN
INTRAMUSCULAR | Status: DC | PRN
  Administered 2016-05-20: 4 mg via INTRAVENOUS

## 2016-05-20 MED ORDER — METOCLOPRAMIDE HCL 5 MG/ML IJ SOLN
10.0000 mg | Freq: Once | INTRAMUSCULAR | Status: DC | PRN
  Filled 2016-05-20: qty 2

## 2016-05-20 MED ORDER — MIDAZOLAM HCL 2 MG/2ML IJ SOLN
INTRAMUSCULAR | Status: AC
  Filled 2016-05-20: qty 2

## 2016-05-20 MED ORDER — MEPERIDINE HCL 25 MG/ML IJ SOLN
6.2500 mg | INTRAMUSCULAR | Status: DC | PRN
  Filled 2016-05-20: qty 1

## 2016-05-20 MED ORDER — CHLORHEXIDINE GLUCONATE CLOTH 2 % EX PADS
6.0000 | MEDICATED_PAD | Freq: Once | CUTANEOUS | Status: DC
  Filled 2016-05-20: qty 6

## 2016-05-20 MED ORDER — KETOROLAC TROMETHAMINE 30 MG/ML IJ SOLN
INTRAMUSCULAR | Status: DC | PRN
  Administered 2016-05-20: 30 mg via INTRAVENOUS

## 2016-05-20 MED ORDER — CEFOTETAN DISODIUM-DEXTROSE 2-2.08 GM-% IV SOLR
INTRAVENOUS | Status: AC
  Filled 2016-05-20: qty 50

## 2016-05-20 MED ORDER — LIDOCAINE 2% (20 MG/ML) 5 ML SYRINGE
INTRAMUSCULAR | Status: AC
  Filled 2016-05-20: qty 5

## 2016-05-20 MED ORDER — ACETAMINOPHEN 325 MG PO TABS
650.0000 mg | ORAL_TABLET | ORAL | Status: DC | PRN
  Filled 2016-05-20: qty 2

## 2016-05-20 MED ORDER — SUCCINYLCHOLINE CHLORIDE 200 MG/10ML IV SOSY
PREFILLED_SYRINGE | INTRAVENOUS | Status: DC | PRN
  Administered 2016-05-20: 140 mg via INTRAVENOUS

## 2016-05-20 MED ORDER — SODIUM CHLORIDE 0.9% FLUSH
3.0000 mL | INTRAVENOUS | Status: DC | PRN
  Filled 2016-05-20: qty 3

## 2016-05-20 MED ORDER — KETOROLAC TROMETHAMINE 30 MG/ML IJ SOLN
INTRAMUSCULAR | Status: AC
  Filled 2016-05-20: qty 1

## 2016-05-20 MED ORDER — LIDOCAINE 5 % EX OINT: 1.0000 "application " | TOPICAL_OINTMENT | CUTANEOUS | 2 refills | Status: DC | PRN

## 2016-05-20 MED ORDER — DEXTROSE 5 % IV SOLN
2.0000 g | INTRAVENOUS | Status: AC
  Administered 2016-05-20: 2 g via INTRAVENOUS
  Filled 2016-05-20: qty 2

## 2016-05-20 MED ORDER — LIDOCAINE HCL 2 % EX GEL
CUTANEOUS | Status: DC | PRN
  Administered 2016-05-20: 1

## 2016-05-20 MED ORDER — FENTANYL CITRATE (PF) 100 MCG/2ML IJ SOLN
INTRAMUSCULAR | Status: AC
  Filled 2016-05-20: qty 2

## 2016-05-20 MED ORDER — MORPHINE SULFATE (PF) 2 MG/ML IV SOLN
2.0000 mg | INTRAVENOUS | Status: DC | PRN
  Filled 2016-05-20: qty 2

## 2016-05-20 MED ORDER — LACTATED RINGERS IV SOLN
INTRAVENOUS | Status: DC
  Filled 2016-05-20: qty 1000

## 2016-05-20 MED ORDER — OXYCODONE HCL 5 MG PO TABS: 5.0000 mg | ORAL_TABLET | ORAL | 0 refills | Status: DC | PRN

## 2016-05-20 MED ORDER — MIDAZOLAM HCL 5 MG/5ML IJ SOLN
INTRAMUSCULAR | Status: DC | PRN
  Administered 2016-05-20: 2 mg via INTRAVENOUS

## 2016-05-20 MED ORDER — DEXAMETHASONE SODIUM PHOSPHATE 10 MG/ML IJ SOLN
INTRAMUSCULAR | Status: AC
  Filled 2016-05-20: qty 1

## 2016-05-20 MED ORDER — FENTANYL CITRATE (PF) 100 MCG/2ML IJ SOLN
25.0000 ug | INTRAMUSCULAR | Status: DC | PRN
  Filled 2016-05-20: qty 1

## 2016-05-20 MED ORDER — ONDANSETRON HCL 4 MG/2ML IJ SOLN
INTRAMUSCULAR | Status: AC
  Filled 2016-05-20: qty 2

## 2016-05-20 MED ORDER — DEXAMETHASONE SODIUM PHOSPHATE 4 MG/ML IJ SOLN
INTRAMUSCULAR | Status: DC | PRN
  Administered 2016-05-20: 10 mg via INTRAVENOUS

## 2016-05-20 MED ORDER — PROPOFOL 10 MG/ML IV BOLUS
INTRAVENOUS | Status: AC
  Filled 2016-05-20: qty 40

## 2016-05-20 MED ORDER — ACETAMINOPHEN 650 MG RE SUPP
650.0000 mg | RECTAL | Status: DC | PRN
  Filled 2016-05-20: qty 1

## 2016-05-20 MED ORDER — BUPIVACAINE LIPOSOME 1.3 % IJ SUSP
INTRAMUSCULAR | Status: DC | PRN
  Administered 2016-05-20: 20 mL

## 2016-05-20 MED ORDER — LACTATED RINGERS IV SOLN
INTRAVENOUS | Status: DC
  Administered 2016-05-20: 11:00:00 via INTRAVENOUS
  Filled 2016-05-20: qty 1000

## 2016-05-20 MED ORDER — PROPOFOL 10 MG/ML IV BOLUS
INTRAVENOUS | Status: DC | PRN
  Administered 2016-05-20: 50 mg via INTRAVENOUS
  Administered 2016-05-20 (×2): 100 mg via INTRAVENOUS
  Administered 2016-05-20: 200 mg via INTRAVENOUS
  Administered 2016-05-20: 50 mg via INTRAVENOUS

## 2016-05-20 MED ORDER — PROPOFOL 10 MG/ML IV BOLUS
INTRAVENOUS | Status: AC
  Filled 2016-05-20: qty 20

## 2016-05-20 SURGICAL SUPPLY — 56 items
APL SKNCLS STERI-STRIP NONHPOA (GAUZE/BANDAGES/DRESSINGS)
BENZOIN TINCTURE PRP APPL 2/3 (GAUZE/BANDAGES/DRESSINGS) IMPLANT
BLADE CLIPPER SURG (BLADE) IMPLANT
BLADE SURG 15 STRL LF DISP TIS (BLADE) ×1 IMPLANT
BLADE SURG 15 STRL SS (BLADE) ×3
BRIEF STRETCH FOR OB PAD LRG (UNDERPADS AND DIAPERS) ×3 IMPLANT
CANISTER SUCTION 1200CC (MISCELLANEOUS) ×3 IMPLANT
CANISTER SUCTION 2500CC (MISCELLANEOUS) IMPLANT
CATH ROBINSON RED A/P 16FR (CATHETERS) IMPLANT
CLEANER CAUTERY TIP 5X5 PAD (MISCELLANEOUS) ×1 IMPLANT
CLOTH BEACON ORANGE TIMEOUT ST (SAFETY) ×3 IMPLANT
COVER BACK TABLE 60X90IN (DRAPES) ×3 IMPLANT
DRAIN PENROSE 18X1/4 LTX STRL (WOUND CARE) IMPLANT
DRAPE LG THREE QUARTER DISP (DRAPES) ×3 IMPLANT
DRAPE UNDERBUTTOCKS STRL (DRAPE) ×3 IMPLANT
DRSG PAD ABDOMINAL 8X10 ST (GAUZE/BANDAGES/DRESSINGS) ×2 IMPLANT
ELECT REM PT RETURN 9FT ADLT (ELECTROSURGICAL) ×3
ELECTRODE REM PT RTRN 9FT ADLT (ELECTROSURGICAL) ×1 IMPLANT
GAUZE PACKING IODOFORM 1/2 (PACKING) IMPLANT
GELFOAM SMALL 12 7MM 315 3 (MISCELLANEOUS) ×1 IMPLANT
GLOVE BIO SURGEON STRL SZ 6.5 (GLOVE) ×1 IMPLANT
GLOVE BIO SURGEON STRL SZ8 (GLOVE) ×1 IMPLANT
GLOVE BIO SURGEONS STRL SZ 6.5 (GLOVE) ×1
GLOVE BIOGEL PI IND STRL 6.5 (GLOVE) IMPLANT
GLOVE BIOGEL PI IND STRL 7.5 (GLOVE) IMPLANT
GLOVE BIOGEL PI IND STRL 8 (GLOVE) IMPLANT
GLOVE BIOGEL PI INDICATOR 6.5 (GLOVE) ×2
GLOVE BIOGEL PI INDICATOR 7.5 (GLOVE) ×2
GLOVE BIOGEL PI INDICATOR 8 (GLOVE) ×2
GLOVE ECLIPSE 8.0 STRL XLNG CF (GLOVE) ×2 IMPLANT
GOWN STRL REUS W/ TWL LRG LVL3 (GOWN DISPOSABLE) ×1 IMPLANT
GOWN STRL REUS W/TWL LRG LVL3 (GOWN DISPOSABLE) ×2 IMPLANT
GOWN STRL REUS W/TWL XL LVL3 (GOWN DISPOSABLE) ×2 IMPLANT
KIT ROOM TURNOVER WOR (KITS) ×3 IMPLANT
LEGGING LITHOTOMY PAIR STRL (DRAPES) ×3 IMPLANT
MANIFOLD NEPTUNE II (INSTRUMENTS) IMPLANT
NEEDLE HYPO 22GX1.5 SAFETY (NEEDLE) ×3 IMPLANT
PACK BASIN DAY SURGERY FS (CUSTOM PROCEDURE TRAY) ×3 IMPLANT
PAD ABD 8X10 STRL (GAUZE/BANDAGES/DRESSINGS) ×2 IMPLANT
PAD CLEANER CAUTERY TIP 5X5 (MISCELLANEOUS) ×2
PAD PREP 24X48 CUFFED NSTRL (MISCELLANEOUS) ×3 IMPLANT
PENCIL BUTTON HOLSTER BLD 10FT (ELECTRODE) ×3 IMPLANT
SPONGE GAUZE 4X4 12PLY STER LF (GAUZE/BANDAGES/DRESSINGS) ×3 IMPLANT
SURGILUBE 2OZ TUBE FLIPTOP (MISCELLANEOUS) ×3 IMPLANT
SUT CHROMIC 2 0 SH (SUTURE) IMPLANT
SUT CHROMIC 3 0 SH 27 (SUTURE) IMPLANT
SUT SILK 0 TIES 10X30 (SUTURE) IMPLANT
SYR CONTROL 10ML LL (SYRINGE) ×3 IMPLANT
TOWEL OR 17X24 6PK STRL BLUE (TOWEL DISPOSABLE) ×6 IMPLANT
TRAY DSU PREP LF (CUSTOM PROCEDURE TRAY) ×3 IMPLANT
TUBE CONNECTING 12'X1/4 (SUCTIONS)
TUBE CONNECTING 12X1/4 (SUCTIONS) IMPLANT
UNDERPAD 30X30 INCONTINENT (UNDERPADS AND DIAPERS) ×3 IMPLANT
VACUUM HOSE 7/8X10 W/ WAND (MISCELLANEOUS) ×2 IMPLANT
WATER STERILE IRR 500ML POUR (IV SOLUTION) ×3 IMPLANT
YANKAUER SUCT BULB TIP NO VENT (SUCTIONS) ×3 IMPLANT

## 2016-05-20 NOTE — Op Note (Signed)
Operative Note  Kendra Becker female 51 y.o. 05/20/2016  PREOPERATIVE DX:  Anal condyloma  POSTOPERATIVE DX:  Same  PROCEDURE:   Anoscopy.  Excision and CO2 laser ablation of anal condyloma         Surgeon: Adolph PollackOSENBOWER,Audryanna Zurita J   Assistants: none  Anesthesia: General endotracheal anesthesia  Indications:   This is a 51 year old female found to have multiple perianal condyloma like lesions. She now presents for the above procedure.    Procedure Detail:  She was brought to the operating room placed supine on the operating table and a general anesthetic given with a LMA. She had some vomiting so this was converted to an endotracheal tube. Suctioning through the endotracheal tube did not demonstrate any gastric contents. She was in the lithotomy position. The perianal area was sterilely prepped and draped. A timeout was performed.  Anoscopy was performed. There were no anal canal condyloma or masses. Using electrocautery, I excised the condyloma from the 10:00 position and the 5:00 position. These were sent to pathology. Using the laser at setting 8, at then ablated small condyloma at the 10:00, 11:00, 12:00, 1:00, 2:00, 4:00, 5:00, and 7:00 positions on the perianal skin.  No further condyloma were noted.  A perianal block was then performed with Exparel.  Xylocaine jelly was applied to the wounds followed by a dry dressing.  She tolerated the operative procedure well.  She was extubated and taken to the PACU in satisfactory condition.  EBL was less than 100 ml.         Specimens: Anal condyloma from the 10:00 and 5:00 positions.        Complications:  * No complications entered in OR log *

## 2016-05-20 NOTE — H&P (Signed)
H&P  Kendra Becker [name], MD  Surgery     Kare L. Roseanne RenoStewart  DOB: 11/06/1965 Separated / Language: [place] / Race: White Female  History of Present Illness  The patient is a 51 year old female.   Note:She was referred by Dr. Loreta AveMann because of a concern for anal warts. She is noted of a number of bumps around her anus which had been increasing in number over time. They do not itch or bleed. She's had a history of skin warts on her legs in the past.  Other Problems  Anxiety Disorder Asthma Depression Hemorrhoids High blood pressure  Past Surgical History  Aneurysm Repair Appendectomy Gallbladder Surgery - Laparoscopic  Diagnostic Studies History  Colonoscopy within last year Mammogram never Pap Smear 1-5 years ago  Allergies  Flagyl *ANTI-INFECTIVE AGENTS - MISC.*  Prior to Admission medications   Medication Sig Start Date End Date Taking? Authorizing Provider  ALPRAZolam Prudy Feeler(XANAX) 0.5 MG tablet Take 0.25 mg by mouth 2 (two) times daily as needed. Reported on 09/21/2015   Yes Historical Provider, MD  lisinopril (PRINIVIL,ZESTRIL) 20 MG tablet Take 20 mg by mouth every evening. Reported on 09/21/2015   Yes Historical Provider, MD     Social History Alcohol use Occasional alcohol use. Caffeine use Carbonated beverages, Coffee. No drug use Tobacco use Current every day smoker.  Family History  Alcohol Abuse Brother, Daughter, Father. Arthritis Mother. Colon Polyps Son. Depression Brother, Daughter, Mother, Son. Heart Disease Daughter. Hypertension Brother, Father, Mother. Respiratory Condition Daughter.  Pregnancy / Birth History Age at menarche 15 years. Age of menopause <46 Contraceptive History Oral contraceptives. Gravida 6 Irregular periods Maternal age 50-20 Para 4  Physical Exam  The physical exam findings are as follows: Note:General: WDWN in NAD. Pleasant and cooperative.  HEENT: Right facial scar. No  facial masses.  EYES: no scleral icterus,  CV: RRR, no murmur, no edema  CHEST: Breath sounds equal and clear. Respirations nonlabored.  ANORECTAL: Multiple small perianal skin lesion suspicious for condyloma on visual inspection. On digital rectal exam, there are no palpable masses or irregularity. No blood is present.  SKIN: Small, raised lesions on her lower legs suspicious for skin warts.  NEUROLOGIC: Alert and oriented, answers questions appropriately.  PSYCHIATRIC: Normal mood, affect , and behavior.    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  Plan: I recommended biopsy and laser ablation as an outpatient procedure. We went over the procedure, rationale, and risks. Risks include but are not limited to bleeding, infection, wound healing problem is, anesthesia. We also talked about the potential for recurrence. She seems to understand this and would like to proceed. I've asked her to try to stop smoking as much as possible as this could complicate wound healing and increase the infection rate.  Kendra Becker Ronnell Clinger, M.D.

## 2016-05-20 NOTE — Anesthesia Procedure Notes (Addendum)
Procedure Name: Intubation Date/Time: 05/20/2016 11:25 AM Performed by: Maris BergerENENNY, Garin Mata T Pre-anesthesia Checklist: Patient identified, Emergency Drugs available, Suction available and Patient being monitored Oxygen Delivery Method: Circle system utilized Intubation Type: Cricoid Pressure applied Tube type: Oral Tube size: 7.0 mm Number of attempts: 1 Airway Equipment and Method: Stylet and Oral airway Placement Confirmation: ETT inserted through vocal cords under direct vision,  positive ETCO2 and breath sounds checked- equal and bilateral Secured at: 22 cm Tube secured with: Tape Dental Injury: Teeth and Oropharynx as per pre-operative assessment  Comments: No secretions at cord on intubation.  ETT suctioned, no secretions .

## 2016-05-20 NOTE — Anesthesia Preprocedure Evaluation (Addendum)
Anesthesia Evaluation  Patient identified by MRN, date of birth, ID band Patient awake    Reviewed: Allergy & Precautions, NPO status , Patient's Chart, lab work & pertinent test results  Airway Mallampati: II  TM Distance: >3 FB Neck ROM: Full    Dental no notable dental hx. (+) Chipped,    Pulmonary Current Smoker,    Pulmonary exam normal breath sounds clear to auscultation       Cardiovascular hypertension, Pt. on medications Normal cardiovascular exam Rhythm:Regular Rate:Normal     Neuro/Psych negative neurological ROS  negative psych ROS   GI/Hepatic negative GI ROS, Neg liver ROS,   Endo/Other  negative endocrine ROS  Renal/GU negative Renal ROS  negative genitourinary   Musculoskeletal negative musculoskeletal ROS (+)   Abdominal   Peds negative pediatric ROS (+)  Hematology negative hematology ROS (+)   Anesthesia Other Findings  Addendum  Post-induction regurgitation after LMA placement. In future would recommend RSI and avoiding LMA.   Reproductive/Obstetrics negative OB ROS                           Anesthesia Physical Anesthesia Plan  ASA: II  Anesthesia Plan: General   Post-op Pain Management:    Induction: Intravenous  Airway Management Planned: LMA  Additional Equipment:   Intra-op Plan:   Post-operative Plan: Extubation in OR  Informed Consent: I have reviewed the patients History and Physical, chart, labs and discussed the procedure including the risks, benefits and alternatives for the proposed anesthesia with the patient or authorized representative who has indicated his/her understanding and acceptance.   Dental advisory given  Plan Discussed with: CRNA  Anesthesia Plan Comments:         Anesthesia Quick Evaluation

## 2016-05-20 NOTE — Transfer of Care (Signed)
Immediate Anesthesia Transfer of Care Note  Patient: Kendra Becker  Procedure(s) Performed: Procedure(s): EXCISION AND LASER ABLATION OF ANAL WARTS (N/A)  Patient Location: PACU  Anesthesia Type:General  Level of Consciousness: awake, alert  and oriented  Airway & Oxygen Therapy: Patient Spontanous Breathing and Patient connected to face mask oxygen  Post-op Assessment: Report given to RN  Post vital signs: Reviewed and stable  Last Vitals: 124/106, 110, 16, 100% Vitals:   05/20/16 0942  BP: 108/77  Pulse: 95  Resp: 18  Temp: 36.3 C    Last Pain:  Vitals:   05/20/16 0942  TempSrc: Oral      Patients Stated Pain Goal: 5 (05/20/16 1041)  Complications: No apparent anesthesia complications

## 2016-05-20 NOTE — Discharge Instructions (Addendum)
[facility] _______[facility]  RECTAL SURGERY POST OP INSTRUCTIONS: POST OP INSTRUCTIONS  Always review your discharge instruction sheet given to you by the facility where your surgery was performed. IF YOU HAVE DISABILITY OR FAMILY LEAVE FORMS, YOU MUST BRING THEM TO THE OFFICE FOR PROCESSING.   DO NOT GIVE THEM TO YOUR DOCTOR.  1. A  prescription for pain medication may be given to you upon discharge.  Take your pain medication as prescribed, if needed.  If narcotic pain medicine is not needed, then you may take acetaminophen (Tylenol) or ibuprofen (Advil) as needed. 2. Take your usually prescribed medications unless otherwise directed. 3. If you need a refill on your pain medication, please contact your pharmacy.  They will contact our office to request authorization. Prescriptions will not be filled after 5 pm or on week-ends. 4. You should follow a light diet the first 48 hours after arrival home, such as liquids,soup and crackers, etc.  Be sure to include lots of fluids daily.  Resume your normal diet 2-3 days after surgery.. 5. Most patients will experience some swelling and discomfort in the rectal area. Ice packs, reclining and warm tub soaks will help.  Swelling and discomfort can take several days to resolve.  6. It is common to experience some constipation if taking pain medication after surgery.  Increasing fluid intake and taking a stool softener (such as Colace) will usually help or prevent this problem from occurring.  A mild laxative (Milk of Magnesia or Miralax) should be taken according to package directions if there are no bowel movements after 48 hours. 7. Unless discharge instructions indicate otherwise, leave your bandage dry and in place for 24 hours, or remove the bandage if you have a bowel movement. You may notice a small amount of bleeding with bowel movements for the first few days. You may have some packing in the rectum which will come out over the first day or  two. You will need to wear an absorbent pad or soft cotton gauze in your underwear until the drainage stops.it. 8. ACTIVITIES:  You may resume regular (light) daily activities beginning the next day--such as daily self-care, walking, climbing stairs--gradually increasing activities as tolerated.  You may have sexual intercourse when it is comfortable.  Refrain from any heavy lifting or straining for 3 days. a. You may drive when you are no longer taking prescription pain medication, you can comfortably wear a seatbelt, and you can safely maneuver your car and apply brakes. b. RETURN TO WORK: : _4-7 days when comfortable.___________________ c.  9. You should see your doctor in the office for a follow-up appointment approximately 2-3 weeks after your surgery.  Make sure that you call for this appointment within a day or two after you arrive home to insure a convenient appointment time. 10. OTHER INSTRUCTIONS:  __________________________________________________________________________________________________________________________________________________________________________________________    WHEN TO CALL YOUR DOCTOR:  1. Fever over 101.0 2. Inability to urinate 3. Nausea and/or vomiting 4. Extreme swelling or bruising 5. Continued bleeding from rectum. 6. Increased pain, redness, or drainage from the incision 7. Constipation  The clinic staff is available to answer your questions during regular business hours.  Please dont hesitate to call and ask to speak to one of the nurses for clinical concerns.  If you have a medical emergency, go to the nearest emergency room or call 911.  [facility] is always on call at the hospital   [address], [address],  [place], [place]  [address] ?  [address] [address], [place], [place]   [address] [phone] ? [phone] ? FAX [phone] Web site: [email or web address]   Post Anesthesia Home Care  Instructions  Activity: Get plenty of rest for the remainder of the day. A responsible adult should stay with you for 24 hours following the procedure.  For the next 24 hours, DO NOT: -Drive a car -Advertising copywriter -Drink alcoholic beverages -Take any medication unless instructed by your physician -Make any legal decisions or sign important papers.  Meals: Start with liquid foods such as gelatin or soup. Progress to regular foods as tolerated. Avoid greasy, spicy, heavy foods. If nausea and/or vomiting occur, drink only clear liquids until the nausea and/or vomiting subsides. Call your physician if vomiting continues.  Special Instructions/Symptoms: Your throat may feel dry or sore from the anesthesia or the breathing tube placed in your throat during surgery. If this causes discomfort, gargle with warm salt water. The discomfort should disappear within 24 hours.  If you had a scopolamine patch placed behind your ear for the management of post- operative nausea and/or vomiting:  1. The medication in the patch is effective for 72 hours, after which it should be removed.  Wrap patch in a tissue and discard in the trash. Wash hands thoroughly with soap and water. 2. You may remove the patch earlier than 72 hours if you experience unpleasant side effects which may include dry mouth, dizziness or visual disturbances. 3. Avoid touching the patch. Wash your hands with soap and water after contact with the patch.   Information for Discharge Teaching: EXPAREL (bupivacaine liposome injectable suspension)   Your surgeon gave you EXPAREL(bupivacaine) in your surgical incision to help control your pain after surgery.   EXPAREL is a local anesthetic that provides pain relief by numbing the tissue around the surgical site.  EXPAREL is designed to release pain medication over time and can control pain for up to 72 hours.  Depending on how you respond to EXPAREL, you may require less pain medication  during your recovery.  Possible side effects:  Temporary loss of sensation or ability to move in the area where bupivacaine was injected.  Nausea, vomiting, constipation  Rarely, numbness and tingling in your mouth or lips, lightheadedness, or anxiety may occur.  Call your doctor right away if you think you may be experiencing any of these sensations, or if you have other questions regarding possible side effects.  Follow all other discharge instructions given to you by your surgeon or nurse. Eat a healthy diet and drink plenty of water or other fluids.  If you return to the hospital for any reason within 96 hours following the administration of EXPAREL, please inform your health care providers.

## 2016-05-20 NOTE — Anesthesia Procedure Notes (Addendum)
Procedure Name: LMA Insertion Date/Time: 05/20/2016 11:19 AM Performed by: Maris BergerENENNY, Meyer Arora T Pre-anesthesia Checklist: Patient identified, Emergency Drugs available, Suction available and Patient being monitored Patient Re-evaluated:Patient Re-evaluated prior to inductionOxygen Delivery Method: Circle system utilized Preoxygenation: Pre-oxygenation with 100% oxygen Intubation Type: IV induction Ventilation: Mask ventilation without difficulty LMA: LMA inserted LMA Size: 4.0 Number of attempts: 1 Airway Equipment and Method: Bite block Placement Confirmation: positive ETCO2 Dental Injury: Teeth and Oropharynx as per pre-operative assessment

## 2016-05-21 ENCOUNTER — Encounter (HOSPITAL_BASED_OUTPATIENT_CLINIC_OR_DEPARTMENT_OTHER): Payer: Self-pay | Admitting: General Surgery

## 2016-05-21 NOTE — Anesthesia Postprocedure Evaluation (Signed)
Anesthesia Post Note  Patient: Kendra Becker  Procedure(s) Performed: Procedure(s) (LRB): EXCISION AND LASER ABLATION OF ANAL CONDYLOMA (N/A)  Patient location during evaluation: PACU Anesthesia Type: General Level of consciousness: awake and alert Pain management: pain level controlled Vital Signs Assessment: post-procedure vital signs reviewed and stable Respiratory status: spontaneous breathing, nonlabored ventilation, respiratory function stable and patient connected to nasal cannula oxygen Cardiovascular status: blood pressure returned to baseline and stable Postop Assessment: no signs of nausea or vomiting Anesthetic complications: no       Last Vitals:  Vitals:   05/20/16 1300 05/20/16 1354  BP: 103/84 107/73  Pulse: 86 83  Resp: 18 20  Temp:  36.6 C    Last Pain:  Vitals:   05/20/16 1354  TempSrc: Oral                 Phillips Groutarignan, Janece Laidlaw

## 2016-06-07 ENCOUNTER — Other Ambulatory Visit: Payer: Self-pay | Admitting: Family Medicine

## 2016-06-07 DIAGNOSIS — R9389 Abnormal findings on diagnostic imaging of other specified body structures: Secondary | ICD-10-CM

## 2016-06-10 ENCOUNTER — Ambulatory Visit
Admission: RE | Admit: 2016-06-10 | Discharge: 2016-06-10 | Disposition: A | Discharge status: Home / Self Care | Payer: Managed Care, Other (non HMO) | Source: Ambulatory Visit | Attending: Family Medicine | Admitting: Family Medicine

## 2016-06-10 DIAGNOSIS — R9389 Abnormal findings on diagnostic imaging of other specified body structures: Secondary | ICD-10-CM

## 2016-06-10 MED ORDER — IOPAMIDOL (ISOVUE-300) INJECTION 61%
75.0000 mL | Freq: Once | INTRAVENOUS | Status: AC | PRN
  Administered 2016-06-10: 75 mL via INTRAVENOUS

## 2017-06-16 ENCOUNTER — Ambulatory Visit (HOSPITAL_COMMUNITY)
Admission: EM | Admit: 2017-06-16 | Discharge: 2017-06-16 | Disposition: A | Discharge status: Home / Self Care | Payer: Self-pay | Attending: Family Medicine | Admitting: Family Medicine

## 2017-06-16 ENCOUNTER — Encounter (HOSPITAL_COMMUNITY): Payer: Self-pay | Admitting: Emergency Medicine

## 2017-06-16 ENCOUNTER — Ambulatory Visit (INDEPENDENT_AMBULATORY_CARE_PROVIDER_SITE_OTHER): Payer: Self-pay

## 2017-06-16 ENCOUNTER — Other Ambulatory Visit: Payer: Self-pay

## 2017-06-16 DIAGNOSIS — R197 Diarrhea, unspecified: Secondary | ICD-10-CM

## 2017-06-16 DIAGNOSIS — R6883 Chills (without fever): Secondary | ICD-10-CM

## 2017-06-16 DIAGNOSIS — R112 Nausea with vomiting, unspecified: Secondary | ICD-10-CM

## 2017-06-16 DIAGNOSIS — J40 Bronchitis, not specified as acute or chronic: Secondary | ICD-10-CM

## 2017-06-16 DIAGNOSIS — R05 Cough: Secondary | ICD-10-CM

## 2017-06-16 LAB — POCT I-STAT, CHEM 8
BUN: 11 mg/dL (ref 6–20)
CREATININE: 1.1 mg/dL — AB (ref 0.44–1.00)
Calcium, Ion: 1.12 mmol/L — ABNORMAL LOW (ref 1.15–1.40)
Chloride: 99 mmol/L — ABNORMAL LOW (ref 101–111)
GLUCOSE: 156 mg/dL — AB (ref 65–99)
HEMATOCRIT: 47 % — AB (ref 36.0–46.0)
Hemoglobin: 16 g/dL — ABNORMAL HIGH (ref 12.0–15.0)
POTASSIUM: 3.9 mmol/L (ref 3.5–5.1)
Sodium: 136 mmol/L (ref 135–145)
TCO2: 23 mmol/L (ref 22–32)

## 2017-06-16 MED ORDER — HYDROCODONE-HOMATROPINE 5-1.5 MG/5ML PO SYRP: 5.0000 mL | ORAL_SOLUTION | Freq: Four times a day (QID) | ORAL | 0 refills | Status: DC | PRN

## 2017-06-16 MED ORDER — ONDANSETRON 8 MG PO TBDP: 8.0000 mg | ORAL_TABLET | Freq: Three times a day (TID) | ORAL | 0 refills | Status: DC | PRN

## 2017-06-16 MED ORDER — AZITHROMYCIN 250 MG PO TABS: 250.0000 mg | ORAL_TABLET | Freq: Every day | ORAL | 0 refills | Status: DC

## 2017-06-16 NOTE — Discharge Instructions (Signed)
This appears to be a viral bronchitis.  Because you are a smoker we are giving you an antibiotic to keep f this from becoming a pneumonia.  Also getting you some cough medicine which should help control his symptoms.

## 2017-06-16 NOTE — ED Triage Notes (Signed)
Pt n/v, cold chills, diarrhea. Flu like symptoms x2 days. Pt hasn't been able to eat. Pt is a smoker.

## 2017-06-16 NOTE — ED Provider Notes (Addendum)
[facility]   [id number] 06/16/17 Arrival Time: 1326   SUBJECTIVE:  Kendra Becker is a 52 y.o. female who presents to the urgent care with complaint of n/v, cold chills, diarrhea. Flu like symptoms x2 days. Pt hasn't been able to eat. Pt is a smoker.   Past Medical History:  Diagnosis Date  . Anal warts   . History of cerebral aneurysm repair    03-17-2006 clipping anterior communicating artery segment, unruptured  . History of diverticulitis of colon   . Hypertension   . Wears glasses    No family history on file. Social History   Socioeconomic History  . Marital status: Legally Separated    Spouse name: Not on file  . Number of children: Not on file  . Years of education: Not on file  . Highest education level: Not on file  Social Needs  . Financial resource strain: Not on file  . Food insecurity - worry: Not on file  . Food insecurity - inability: Not on file  . Transportation needs - medical: Not on file  . Transportation needs - non-medical: Not on file  Occupational History  . Not on file  Tobacco Use  . Smoking status: Current Every Day Smoker    Packs/day: 1.50    Years: 28.00    Pack years: 42.00    Types: Cigarettes  . Smokeless tobacco: Never Used  Substance and Sexual Activity  . Alcohol use: Yes    Comment: occasional  . Drug use: No  . Sexual activity: Yes  Other Topics Concern  . Not on file  Social History Narrative  . Not on file   No outpatient medications have been marked as taking for the 06/16/17 encounter [facility] Encounter).   Allergies  Allergen Reactions  . Flagyl [Metronidazole] Itching    redness      ROS: As per HPI, remainder of ROS negative.   OBJECTIVE:   Vitals:   06/16/17 1345  BP: 127/83  Pulse: (!) 133  Resp: (!) 30  Temp: 99.7 F (37.6 C)  SpO2: 92%     General appearance: alert;mild distress Eyes: PERRL; EOMI; conjunctiva normal HENT: normocephalic; atraumatic; TMs normal, canal normal,  external ears normal without trauma; nasal mucosa normal; oral mucosa normal Neck: supple Lungs: decreased BS bilaterally Heart: regular rate and rhythm Abdomen: soft, non-tender; bowel sounds diminished; no masses or organomegaly; no guarding or rebound tenderness Back: no CVA tenderness Extremities: no cyanosis or edema; symmetrical with no gross deformities Skin: warm and dry Neurologic: normal gait; grossly normal Psychological: alert and cooperative; normal mood and affect      Labs:  Results for orders placed or performed during the hospital encounter of 06/16/17  I-STAT, chem 8  Result Value Ref Range   Sodium 136 135 - 145 mmol/L   Potassium 3.9 3.5 - 5.1 mmol/L   Chloride 99 (L) 101 - 111 mmol/L   BUN 11 6 - 20 mg/dL   Creatinine, Ser 4.09 (H) 0.44 - 1.00 mg/dL   Glucose, Bld 811 (H) 65 - 99 mg/dL   Calcium, Ion 9.14 (L) 1.15 - 1.40 mmol/L   TCO2 23 22 - 32 mmol/L   Hemoglobin 16.0 (H) 12.0 - 15.0 g/dL   HCT 78.2 (H) 95.6 - 21.3 %    Labs Reviewed  POCT I-STAT, CHEM 8 - Abnormal; Notable for the following components:      Result Value   Chloride 99 (*)    Creatinine, Ser 1.10 (*)  Glucose, Bld 156 (*)    Calcium, Ion 1.12 (*)    Hemoglobin 16.0 (*)    HCT 47.0 (*)    All other components within normal limits    Dg Chest 2 View  Result Date: 06/16/2017 CLINICAL DATA:  Cough, fever. EXAM: CHEST  2 VIEW COMPARISON:  Radiographs of May 26, 2016. FINDINGS: The heart size and mediastinal contours are within normal limits. Both lungs are clear. No pneumothorax or pleural effusion is noted. The visualized skeletal structures are unremarkable. IMPRESSION: No active cardiopulmonary disease. Electronically Signed   By: Lupita RaiderJames  Green Jr, M.D.   On: 06/16/2017 14:43       ASSESSMENT & PLAN:  1. Bronchitis     Meds ordered this encounter  Medications  . HYDROcodone-homatropine (HYDROMET) 5-1.5 MG/5ML syrup    Sig: Take 5 mLs by mouth every 6 (six) hours as  needed for cough.    Dispense:  60 mL    Refill:  0  . azithromycin (ZITHROMAX) 250 MG tablet    Sig: Take 1 tablet (250 mg total) by mouth daily. Take first 2 tablets together, then 1 every day until finished.    Dispense:  6 tablet    Refill:  0  . ondansetron (ZOFRAN-ODT) 8 MG disintegrating tablet    Sig: Take 1 tablet (8 mg total) by mouth every 8 (eight) hours as needed for nausea.    Dispense:  12 tablet    Refill:  0    Reviewed expectations re: course of current medical issues. Questions answered. Outlined signs and symptoms indicating need for more acute intervention. Patient verbalized understanding. After Visit Summary given.    Procedures:      Elvina SidleLauenstein, Tuwanda Vokes, MD 06/16/17 [id number]    Elvina SidleLauenstein, Maniah Nading, MD 06/16/17 1504

## 2018-01-06 ENCOUNTER — Encounter (HOSPITAL_COMMUNITY): Payer: Self-pay | Admitting: Emergency Medicine

## 2018-01-06 ENCOUNTER — Ambulatory Visit (HOSPITAL_COMMUNITY)
Admission: EM | Admit: 2018-01-06 | Discharge: 2018-01-06 | Disposition: A | Discharge status: Home / Self Care | Payer: BLUE CROSS/BLUE SHIELD | Attending: Family Medicine | Admitting: Family Medicine

## 2018-01-06 DIAGNOSIS — B029 Zoster without complications: Secondary | ICD-10-CM

## 2018-01-06 MED ORDER — VALACYCLOVIR HCL 1 G PO TABS: 1000.0000 mg | ORAL_TABLET | Freq: Three times a day (TID) | ORAL | 0 refills | Status: DC

## 2018-01-06 NOTE — ED Notes (Signed)
Pt c/o rash underneath L breast, pt concerned for shingles, hx of chicken pox

## 2018-01-06 NOTE — ED Provider Notes (Signed)
[facility]    CSN: [id number] Arrival date & time: 01/06/18  1032     History   Chief Complaint Chief Complaint  Patient presents with  . Rash    Possible Shingles    HPI Kendra Becker is a 52 y.o. female.   HPI  She has a blistering rash underneath her left breast present for 5 days.  Is moderately uncomfortable, "sore" to touch.  No itching.  Initially it was just a couple of spots, that it grew into a bigger area.  She thinks that it has stopped growing.  Some of the blisters have ruptured.  She is never had a shingles shot.  She did have chickenpox as a child.  She does not have any fever, myalgias, or systemic symptoms  Past Medical History:  Diagnosis Date  . Anal warts   . History of cerebral aneurysm repair    03-17-2006 clipping anterior communicating artery segment, unruptured  . History of diverticulitis of colon   . Hypertension   . Wears glasses     Patient Active Problem List   Diagnosis Date Noted  . Diverticulitis 09/21/2015    Past Surgical History:  Procedure Laterality Date  . LAPAROSCOPIC CHOLECYSTECTOMY  01/18/2003  . LAPAROSCOPY APPENDECTOMY AND PARTIAL CECECTOMY  2000  . LASER ABLATION CONDOLAMATA N/A 05/20/2016   Procedure: EXCISION AND LASER ABLATION OF ANAL CONDYLOMA;  Surgeon: Avel Peace, MD;  Location: [facility];  Service: General;  Laterality: N/A;  . RIGHT PTERIONAL CRANIOTOMY CLIPPING ANTERIOR COMMUNICATING ARTERY SEGMENT ANEURYSM MICRODISSECTION  03/17/2006   unruptured    OB History   None      Home Medications    Prior to Admission medications   Medication Sig Start Date End Date Taking? Authorizing Provider  escitalopram (LEXAPRO) 10 MG tablet Take 10 mg by mouth daily.   Yes [provider]  ALPRAZolam Prudy Feeler) 0.5 MG tablet Take 0.25 mg by mouth 2 (two) times daily as needed. Reported on 09/21/2015    [provider]  lisinopril (PRINIVIL,ZESTRIL) 20 MG tablet Take 20 mg  by mouth every evening. Reported on 09/21/2015    [provider]  valACYclovir (VALTREX) 1000 MG tablet Take 1 tablet (1,000 mg total) by mouth 3 (three) times daily. 01/06/18   Eustace Moore, MD    Family History No family history on file.  Social History Social History   Tobacco Use  . Smoking status: Current Every Day Smoker    Packs/day: 1.50    Years: 28.00    Pack years: 42.00    Types: Cigarettes  . Smokeless tobacco: Never Used  Substance Use Topics  . Alcohol use: Yes    Comment: occasional  . Drug use: No     Allergies   Flagyl [metronidazole]   Review of Systems Review of Systems  Constitutional: Negative for chills and fever.  HENT: Negative for ear pain and sore throat.   Eyes: Negative for pain and visual disturbance.  Respiratory: Negative for cough and shortness of breath.   Cardiovascular: Negative for chest pain and palpitations.  Gastrointestinal: Negative for abdominal pain and vomiting.  Genitourinary: Negative for dysuria and hematuria.  Musculoskeletal: Negative for arthralgias and back pain.  Skin: Positive for rash. Negative for color change.  Neurological: Negative for seizures and syncope.  All other systems reviewed and are negative.    Physical Exam Triage Vital Signs ED Triage Vitals [01/06/18 1130]  Enc Vitals Group     BP  117/84     Pulse Rate 87     Resp 18     Temp 97.8 F (36.6 C)     Temp src      SpO2 100 %     Weight      Height      Head Circumference      Peak Flow      Pain Score 8     Pain Loc      Pain Edu?      Excl. in GC?    No data found.  Updated Vital Signs BP 117/84   Pulse 87   Temp 97.8 F (36.6 C)   Resp 18   LMP 09/15/2011   SpO2 100%   Visual Acuity Right Eye Distance:   Left Eye Distance:   Bilateral Distance:    Right Eye Near:   Left Eye Near:    Bilateral Near:     Physical Exam  Constitutional: She appears well-developed and well-nourished. No distress.  HENT:    Head: Normocephalic and atraumatic.  Mouth/Throat: Oropharynx is clear and moist.  Eyes: Pupils are equal, round, and reactive to light. Conjunctivae are normal.  Neck: Normal range of motion.  Cardiovascular: Normal rate.  Pulmonary/Chest: Effort normal. No respiratory distress.  Abdominal: Soft. She exhibits no distension.  Musculoskeletal: Normal range of motion. She exhibits no edema.  Neurological: She is alert.  Skin: Skin is warm and dry.  Underneath left breast there is an oval patch measuring 3 cm x 7 cm, cluster of vesicles, some unruptured.  No surrounding erythema or tenderness suggestive of cellulitis.  Psychiatric: She has a normal mood and affect. Her behavior is normal.     UC Treatments / Results  Labs (all labs ordered are listed, but only abnormal results are displayed) Labs Reviewed - No data to display  EKG None  Radiology No results found.  Procedures Procedures (including critical care time)  Medications Ordered in UC Medications - No data to display  Initial Impression / Assessment and Plan / UC Course  I have reviewed the triage vital signs and the nursing notes.  Pertinent labs & imaging results that were available during my care of the patient were reviewed by me and considered in my medical decision making (see chart for details).     Discussed shingles.  Discussed Shingrix vaccination and follow-up with her primary care physician Final Clinical Impressions(s) / UC Diagnoses   Final diagnoses:  Herpes zoster without complication     Discharge Instructions     Take the valacyclovir three times a day Return for any problems   ED Prescriptions    Medication Sig Dispense Auth. Provider   valACYclovir (VALTREX) 1000 MG tablet Take 1 tablet (1,000 mg total) by mouth 3 (three) times daily. 21 tablet Eustace MooreNelson, Dyke Weible Sue, MD     Controlled Substance Prescriptions [place] Controlled Substance Registry consulted? Not Applicable   Eustace MooreNelson,  Terisha Losasso Sue, MD 01/06/18 1154

## 2018-01-06 NOTE — Discharge Instructions (Signed)
Take the valacyclovir three times a day Return for any problems

## 2018-02-21 ENCOUNTER — Ambulatory Visit (HOSPITAL_COMMUNITY)
Admission: EM | Admit: 2018-02-21 | Discharge: 2018-02-21 | Disposition: A | Discharge status: Home / Self Care | Payer: BLUE CROSS/BLUE SHIELD | Attending: Family Medicine | Admitting: Family Medicine

## 2018-02-21 ENCOUNTER — Other Ambulatory Visit: Payer: Self-pay

## 2018-02-21 ENCOUNTER — Encounter (HOSPITAL_COMMUNITY): Payer: Self-pay

## 2018-02-21 DIAGNOSIS — Z79899 Other long term (current) drug therapy: Secondary | ICD-10-CM | POA: Diagnosis not present

## 2018-02-21 DIAGNOSIS — J4541 Moderate persistent asthma with (acute) exacerbation: Secondary | ICD-10-CM | POA: Diagnosis not present

## 2018-02-21 DIAGNOSIS — R0602 Shortness of breath: Secondary | ICD-10-CM

## 2018-02-21 DIAGNOSIS — J029 Acute pharyngitis, unspecified: Secondary | ICD-10-CM

## 2018-02-21 DIAGNOSIS — F17210 Nicotine dependence, cigarettes, uncomplicated: Secondary | ICD-10-CM

## 2018-02-21 DIAGNOSIS — I10 Essential (primary) hypertension: Secondary | ICD-10-CM | POA: Insufficient documentation

## 2018-02-21 DIAGNOSIS — R05 Cough: Secondary | ICD-10-CM

## 2018-02-21 DIAGNOSIS — Z9889 Other specified postprocedural states: Secondary | ICD-10-CM | POA: Diagnosis not present

## 2018-02-21 DIAGNOSIS — Z881 Allergy status to other antibiotic agents status: Secondary | ICD-10-CM | POA: Insufficient documentation

## 2018-02-21 DIAGNOSIS — R062 Wheezing: Secondary | ICD-10-CM | POA: Diagnosis not present

## 2018-02-21 LAB — POCT RAPID STREP A: STREPTOCOCCUS, GROUP A SCREEN (DIRECT): NEGATIVE

## 2018-02-21 MED ORDER — CEFTRIAXONE SODIUM 1 G IJ SOLR
1.0000 g | Freq: Once | INTRAMUSCULAR | Status: AC
  Administered 2018-02-21: 1 g via INTRAMUSCULAR

## 2018-02-21 MED ORDER — ALBUTEROL SULFATE HFA 108 (90 BASE) MCG/ACT IN AERS
2.0000 | INHALATION_SPRAY | Freq: Once | RESPIRATORY_TRACT | Status: AC
  Administered 2018-02-21: 2 via RESPIRATORY_TRACT

## 2018-02-21 MED ORDER — ALBUTEROL SULFATE HFA 108 (90 BASE) MCG/ACT IN AERS
INHALATION_SPRAY | RESPIRATORY_TRACT | Status: AC
  Filled 2018-02-21: qty 6.7

## 2018-02-21 MED ORDER — METHYLPREDNISOLONE ACETATE 80 MG/ML IJ SUSP
80.0000 mg | Freq: Once | INTRAMUSCULAR | Status: AC
  Administered 2018-02-21: 80 mg via INTRAMUSCULAR

## 2018-02-21 MED ORDER — CEFTRIAXONE SODIUM 1 G IJ SOLR
INTRAMUSCULAR | Status: AC
  Filled 2018-02-21: qty 10

## 2018-02-21 MED ORDER — METHYLPREDNISOLONE ACETATE 80 MG/ML IJ SUSP
INTRAMUSCULAR | Status: AC
  Filled 2018-02-21: qty 1

## 2018-02-21 NOTE — Discharge Instructions (Signed)
SPECT to see improvement over the next 24 hours so that you can return to work on Friday.  If you are not improving, please return here or go to the emergency room.

## 2018-02-21 NOTE — ED Provider Notes (Signed)
[facility]    CSN: [id number] Arrival date & time: 02/21/18  1316     History   Chief Complaint Chief Complaint  Patient presents with  . Sore Throat  . Shortness of Breath    HPI Kendra Becker is a 52 y.o. female.   Pt states she has a sore throat and cough and SOB x 4 days.  Patient was exposed to a fellow worker last Thursday and she developed sore throat on Friday which worsened until Sunday.  By Sunday the sore throat has lessened and she developed a cough.  The cough is gotten worse along with wheezing.  Patient has had acute bronchitis in the past with wheezing.  She is a smoker.  She has tried to cut back during this illness.  She has shortness of breath, particularly at night.  She has no chest pain or fever.     Past Medical History:  Diagnosis Date  . Anal warts   . History of cerebral aneurysm repair    03-17-2006 clipping anterior communicating artery segment, unruptured  . History of diverticulitis of colon   . Hypertension   . Wears glasses     Patient Active Problem List   Diagnosis Date Noted  . Diverticulitis 09/21/2015    Past Surgical History:  Procedure Laterality Date  . LAPAROSCOPIC CHOLECYSTECTOMY  01/18/2003  . LAPAROSCOPY APPENDECTOMY AND PARTIAL CECECTOMY  2000  . LASER ABLATION CONDOLAMATA N/A 05/20/2016   Procedure: EXCISION AND LASER ABLATION OF ANAL CONDYLOMA;  Surgeon: Avel Peace, MD;  Location: [facility];  Service: General;  Laterality: N/A;  . RIGHT PTERIONAL CRANIOTOMY CLIPPING ANTERIOR COMMUNICATING ARTERY SEGMENT ANEURYSM MICRODISSECTION  03/17/2006   unruptured    OB History   None      Home Medications    Prior to Admission medications   Medication Sig Start Date End Date Taking? Authorizing Provider  ALPRAZolam Prudy Feeler) 0.5 MG tablet Take 0.25 mg by mouth 2 (two) times daily as needed. Reported on 09/21/2015    [provider]  escitalopram (LEXAPRO) 10 MG tablet Take 10 mg  by mouth daily.    [provider]  lisinopril (PRINIVIL,ZESTRIL) 20 MG tablet Take 20 mg by mouth every evening. Reported on 09/21/2015    [provider]  valACYclovir (VALTREX) 1000 MG tablet Take 1 tablet (1,000 mg total) by mouth 3 (three) times daily. 01/06/18   Eustace Moore, MD    Family History History reviewed. No pertinent family history.  Social History Social History   Tobacco Use  . Smoking status: Current Every Day Smoker    Packs/day: 1.50    Years: 28.00    Pack years: 42.00    Types: Cigarettes  . Smokeless tobacco: Never Used  Substance Use Topics  . Alcohol use: Yes    Comment: occasional  . Drug use: No     Allergies   Flagyl [metronidazole]   Review of Systems Review of Systems  Constitutional: Positive for diaphoresis.  HENT: Positive for sore throat.   Respiratory: Positive for chest tightness, shortness of breath and wheezing.   Musculoskeletal: Positive for myalgias.     Physical Exam Triage Vital Signs ED Triage Vitals  Enc Vitals Group     BP 02/21/18 1347 128/89     Pulse Rate 02/21/18 1347 100     Resp 02/21/18 1347 18     Temp 02/21/18 1347 98.4 F (36.9 C)     Temp Source 02/21/18 1347 Oral  SpO2 02/21/18 1347 100 %     Weight 02/21/18 1348 165 lb (74.8 kg)     Height --      Head Circumference --      Peak Flow --      Pain Score 02/21/18 1347 8     Pain Loc --      Pain Edu? --      Excl. in GC? --    No data found.  Updated Vital Signs BP 128/89 (BP Location: Left Arm)   Pulse 100   Temp 98.4 F (36.9 C) (Oral)   Resp 18   Wt 74.8 kg   LMP 09/15/2011   SpO2 100%   BMI 24.37 kg/m    Physical Exam  Constitutional: She appears well-developed and well-nourished.  HENT:  Head: Normocephalic and atraumatic.  Right Ear: Hearing, tympanic membrane and ear canal normal.  Left Ear: Hearing, tympanic membrane and ear canal normal.  Mouth/Throat: Uvula is midline, oropharynx is clear and moist  and mucous membranes are normal.  Eyes: Pupils are equal, round, and reactive to light. EOM are normal.  Neck: Normal range of motion. Neck supple.  Cardiovascular: Normal rate and normal heart sounds.  Pulmonary/Chest: Effort normal. She has wheezes.  Neurological: She is alert.  Skin: Skin is warm and dry.  Psychiatric: She has a normal mood and affect.  Nursing note and vitals reviewed.    UC Treatments / Results  Labs (all labs ordered are listed, but only abnormal results are displayed) Labs Reviewed  CULTURE, GROUP A STREP [facility])  POCT RAPID STREP A    EKG None  Radiology No results found.  Procedures Procedures (including critical care time)  Medications Ordered in UC Medications  cefTRIAXone (ROCEPHIN) injection 1 g (has no administration in time range)  methylPREDNISolone acetate (DEPO-MEDROL) injection 80 mg (has no administration in time range)  albuterol (PROVENTIL HFA;VENTOLIN HFA) 108 (90 Base) MCG/ACT inhaler 2 puff (has no administration in time range)    Initial Impression / Assessment and Plan / UC Course  I have reviewed the triage vital signs and the nursing notes.  Pertinent labs & imaging results that were available during my care of the patient were reviewed by me and considered in my medical decision making (see chart for details).    Final Clinical Impressions(s) / UC Diagnoses   Final diagnoses:  Moderate persistent asthma with exacerbation     Discharge Instructions     SPECT to see improvement over the next 24 hours so that you can return to work on Friday.  If you are not improving, please return here or go to the emergency room.    ED Prescriptions    None     Controlled Substance Prescriptions [place] Controlled Substance Registry consulted? Not Applicable   Elvina Sidle, MD 02/21/18 1428

## 2018-02-21 NOTE — ED Triage Notes (Signed)
Pt states she has a sore throat and cough and SOB x 4days

## 2018-02-22 ENCOUNTER — Telehealth (HOSPITAL_COMMUNITY): Payer: Self-pay | Admitting: Emergency Medicine

## 2018-02-22 NOTE — Telephone Encounter (Signed)
Pt requesting a new doctors note to return to work tomorrow instead of Friday.  She states she is feeling much better.  Dr. Tracie Harrier stated it would be okay to give the patient a new note.  Pt given a note to clear her back to work tomorrow instead of Friday.

## 2018-02-24 LAB — CULTURE, GROUP A STREP (THRC)

## 2019-12-12 ENCOUNTER — Ambulatory Visit (HOSPITAL_COMMUNITY)
Admission: EM | Admit: 2019-12-12 | Discharge: 2019-12-12 | Disposition: A | Discharge status: Home / Self Care | Payer: HRSA Program | Attending: Emergency Medicine | Admitting: Emergency Medicine

## 2019-12-12 ENCOUNTER — Encounter (HOSPITAL_COMMUNITY): Payer: Self-pay

## 2019-12-12 ENCOUNTER — Other Ambulatory Visit: Payer: Self-pay

## 2019-12-12 DIAGNOSIS — Z79899 Other long term (current) drug therapy: Secondary | ICD-10-CM | POA: Insufficient documentation

## 2019-12-12 DIAGNOSIS — I10 Essential (primary) hypertension: Secondary | ICD-10-CM | POA: Insufficient documentation

## 2019-12-12 DIAGNOSIS — Z9049 Acquired absence of other specified parts of digestive tract: Secondary | ICD-10-CM | POA: Diagnosis not present

## 2019-12-12 DIAGNOSIS — Z881 Allergy status to other antibiotic agents status: Secondary | ICD-10-CM | POA: Diagnosis not present

## 2019-12-12 DIAGNOSIS — Z20822 Contact with and (suspected) exposure to covid-19: Secondary | ICD-10-CM | POA: Diagnosis not present

## 2019-12-12 DIAGNOSIS — Z8249 Family history of ischemic heart disease and other diseases of the circulatory system: Secondary | ICD-10-CM | POA: Diagnosis not present

## 2019-12-12 DIAGNOSIS — Z7952 Long term (current) use of systemic steroids: Secondary | ICD-10-CM | POA: Diagnosis not present

## 2019-12-12 DIAGNOSIS — J45909 Unspecified asthma, uncomplicated: Secondary | ICD-10-CM | POA: Diagnosis not present

## 2019-12-12 DIAGNOSIS — J019 Acute sinusitis, unspecified: Secondary | ICD-10-CM | POA: Insufficient documentation

## 2019-12-12 DIAGNOSIS — F17210 Nicotine dependence, cigarettes, uncomplicated: Secondary | ICD-10-CM | POA: Diagnosis not present

## 2019-12-12 LAB — SARS CORONAVIRUS 2 (TAT 6-24 HRS): SARS Coronavirus 2: NEGATIVE

## 2019-12-12 MED ORDER — ALBUTEROL SULFATE HFA 108 (90 BASE) MCG/ACT IN AERS
INHALATION_SPRAY | RESPIRATORY_TRACT | Status: AC
  Filled 2019-12-12: qty 6.7

## 2019-12-12 MED ORDER — AMOXICILLIN-POT CLAVULANATE 875-125 MG PO TABS: 1.0000 | ORAL_TABLET | Freq: Two times a day (BID) | ORAL | 0 refills | Status: AC

## 2019-12-12 MED ORDER — FLUTICASONE PROPIONATE 50 MCG/ACT NA SUSP: 1.0000 | Freq: Every day | NASAL | 0 refills | Status: DC

## 2019-12-12 MED ORDER — LISINOPRIL 30 MG PO TABS: 30.0000 mg | ORAL_TABLET | Freq: Every evening | ORAL | 0 refills | Status: DC

## 2019-12-12 MED ORDER — BENZONATATE 200 MG PO CAPS
200.0000 mg | ORAL_CAPSULE | Freq: Three times a day (TID) | ORAL | 0 refills | Status: AC | PRN
Start: 2019-12-12 — End: 2019-12-19

## 2019-12-12 MED ORDER — PREDNISONE 20 MG PO TABS: 40.0000 mg | ORAL_TABLET | Freq: Every day | ORAL | 0 refills | Status: AC

## 2019-12-12 MED ORDER — ALBUTEROL SULFATE HFA 108 (90 BASE) MCG/ACT IN AERS
2.0000 | INHALATION_SPRAY | Freq: Once | RESPIRATORY_TRACT | Status: AC
  Administered 2019-12-12: 17:00:00 2 via RESPIRATORY_TRACT

## 2019-12-12 MED ORDER — ESCITALOPRAM OXALATE 10 MG PO TABS: 10.0000 mg | ORAL_TABLET | Freq: Every day | ORAL | 0 refills | Status: DC

## 2019-12-12 NOTE — Discharge Instructions (Signed)
Augmentin twice daily x 1 week Albuterol inhaler as needed Prednisone daily x 5 days Tessalon/benzonatate every 8 hours for cough Flonase nasal spray Rest and fluids  Follow up if not improving or worsneing

## 2019-12-12 NOTE — ED Provider Notes (Signed)
[facility]    CSN: [id number] Arrival date & time: 12/12/19  1358      History   Chief Complaint Chief Complaint  Patient presents with  . Nasal Congestion    HPI Kendra Becker is a 54 y.o. female history of hypertension, presenting today for evaluation of URI symptoms.  Patient reports over the past 2 weeks she has had nasal congestion, sinus pressure, rhinorrhea as well as pressure in ears.  Of recently she is also developed a cough and postnasal drainage into her chest.  Has had some wheezing.  Reports history of asthma as well as tobacco use.  Has been Covid vaccinated.  Using over-the-counter allergy/cold medicine without relief.  HPI  Past Medical History:  Diagnosis Date  . Anal warts   . History of cerebral aneurysm repair    03-17-2006 clipping anterior communicating artery segment, unruptured  . History of diverticulitis of colon   . Hypertension   . Wears glasses     Patient Active Problem List   Diagnosis Date Noted  . Diverticulitis 09/21/2015    Past Surgical History:  Procedure Laterality Date  . LAPAROSCOPIC CHOLECYSTECTOMY  01/18/2003  . LAPAROSCOPY APPENDECTOMY AND PARTIAL CECECTOMY  2000  . LASER ABLATION CONDOLAMATA N/A 05/20/2016   Procedure: EXCISION AND LASER ABLATION OF ANAL CONDYLOMA;  Surgeon: Avel Peace, MD;  Location: [facility];  Service: General;  Laterality: N/A;  . RIGHT PTERIONAL CRANIOTOMY CLIPPING ANTERIOR COMMUNICATING ARTERY SEGMENT ANEURYSM MICRODISSECTION  03/17/2006   unruptured    OB History   No obstetric history on file.      Home Medications    Prior to Admission medications   Medication Sig Start Date End Date Taking? Authorizing Provider  ALPRAZolam Prudy Feeler) 0.5 MG tablet Take 0.25 mg by mouth 2 (two) times daily as needed. Reported on 09/21/2015    [provider]  amoxicillin-clavulanate (AUGMENTIN) 875-125 MG tablet Take 1 tablet by mouth every 12 (twelve) hours for 7 days.  12/12/19 12/19/19  Enez Monahan C, [name]  benzonatate (TESSALON) 200 MG capsule Take 1 capsule (200 mg total) by mouth 3 (three) times daily as needed for up to 7 days for cough. 12/12/19 12/19/19  Polo Mcmartin C, [name]  escitalopram (LEXAPRO) 10 MG tablet Take 1 tablet (10 mg total) by mouth daily. 12/12/19   Dajanique Robley C, [name]  fluticasone (FLONASE) 50 MCG/ACT nasal spray Place 1-2 sprays into both nostrils daily for 7 days. 12/12/19 12/19/19  Timotheus Salm C, [name]  lisinopril (ZESTRIL) 30 MG tablet Take 1 tablet (30 mg total) by mouth every evening. Reported on 09/21/2015 12/12/19   Rafeef Lau C, [name]  predniSONE (DELTASONE) 20 MG tablet Take 2 tablets (40 mg total) by mouth daily for 5 days. 12/12/19 12/17/19  Cherilyn Sautter C, [name]  valACYclovir (VALTREX) 1000 MG tablet Take 1 tablet (1,000 mg total) by mouth 3 (three) times daily. 01/06/18   Eustace Moore, MD    Family History Family History  Problem Relation Age of Onset  . Hypertension Mother   . Aneurysm Father     Social History Social History   Tobacco Use  . Smoking status: Current Every Day Smoker    Packs/day: 1.50    Years: 28.00    Pack years: 42.00    Types: Cigarettes  . Smokeless tobacco: Never Used  Substance Use Topics  . Alcohol use: Yes    Comment: occasional  . Drug use: No     Allergies  Flagyl [metronidazole]   Review of Systems Review of Systems  Constitutional: Positive for fatigue. Negative for activity change, appetite change, chills and fever.  HENT: Positive for congestion, ear pain, rhinorrhea and sinus pressure. Negative for sore throat and trouble swallowing.   Eyes: Negative for discharge and redness.  Respiratory: Positive for cough. Negative for chest tightness and shortness of breath.   Cardiovascular: Negative for chest pain.  Gastrointestinal: Negative for abdominal pain, diarrhea, nausea and vomiting.  Musculoskeletal: Negative for myalgias.  Skin: Negative for rash.    Neurological: Positive for headaches. Negative for dizziness and light-headedness.     Physical Exam Triage Vital Signs ED Triage Vitals  Enc Vitals Group     BP 12/12/19 1532 (!) 168/114     Pulse Rate 12/12/19 1532 98     Resp 12/12/19 1532 16     Temp 12/12/19 1532 98.7 F (37.1 C)     Temp Source 12/12/19 1532 Oral     SpO2 12/12/19 1532 99 %     Weight --      Height --      Head Circumference --      Peak Flow --      Pain Score 12/12/19 1539 8     Pain Loc --      Pain Edu? --      Excl. in GC? --    No data found.  Updated Vital Signs BP (!) 168/114 (BP Location: Right Arm)   Pulse 98   Temp 98.7 F (37.1 C) (Oral)   Resp 16   LMP 09/15/2011   SpO2 99%   Visual Acuity Right Eye Distance:   Left Eye Distance:   Bilateral Distance:    Right Eye Near:   Left Eye Near:    Bilateral Near:     Physical Exam Vitals and nursing note reviewed.  Constitutional:      Appearance: She is well-developed.     Comments: No acute distress  HENT:     Head: Normocephalic and atraumatic.     Ears:     Comments: Bilateral ears without tenderness to palpation of external auricle, tragus and mastoid, EAC's without erythema or swelling, TM's with good bony landmarks and cone of light. Non erythematous.     Nose: Nose normal.     Mouth/Throat:     Comments: Oral mucosa pink and moist, no tonsillar enlargement or exudate. Posterior pharynx patent and nonerythematous, no uvula deviation or swelling. Normal phonation. Eyes:     Conjunctiva/sclera: Conjunctivae normal.  Cardiovascular:     Rate and Rhythm: Normal rate.  Pulmonary:     Effort: Pulmonary effort is normal. No respiratory distress.     Comments: Breathing comfortably at rest, CTABL, no wheezing, rales or other adventitious sounds auscultated  Right-sided anterior chest tenderness to palpation Abdominal:     General: There is no distension.  Musculoskeletal:        General: Normal range of motion.      Cervical back: Neck supple.  Skin:    General: Skin is warm and dry.  Neurological:     Mental Status: She is alert and oriented to person, place, and time.      UC Treatments / Results  Labs (all labs ordered are listed, but only abnormal results are displayed) Labs Reviewed  SARS CORONAVIRUS 2 (TAT 6-24 HRS)    EKG   Radiology No results found.  Procedures Procedures (including critical care time)  Medications Ordered in UC Medications  albuterol (VENTOLIN HFA) 108 (90 Base) MCG/ACT inhaler 2 puff (has no administration in time range)    Initial Impression / Assessment and Plan / UC Course  I have reviewed the triage vital signs and the nursing notes.  Pertinent labs & imaging results that were available during my care of the patient were reviewed by me and considered in my medical decision making (see chart for details).     1.  Sinusitis-2 weeks of URI symptoms, treating with Augmentin and course of prednisone.  Providing albuterol inhaler to use for any asthma/wheezing.  Tessalon for cough, Flonase.  Rest and fluids.  Covid test pending for screening.  2.  Medication refill-refilled lisinopril and Lexapro, follow-up with PCP once insurance begins.  Monitor blood pressure at home.  Discussed strict return precautions. Patient verbalized understanding and is agreeable with plan.  Final Clinical Impressions(s) / UC Diagnoses   Final diagnoses:  Acute sinusitis with symptoms > 10 days  Essential hypertension     Discharge Instructions     Augmentin twice daily x 1 week Albuterol inhaler as needed Prednisone daily x 5 days Tessalon/benzonatate every 8 hours for cough Flonase nasal spray Rest and fluids  Follow up if not improving or worsneing    ED Prescriptions    Medication Sig Dispense Auth. Provider   lisinopril (ZESTRIL) 30 MG tablet Take 1 tablet (30 mg total) by mouth every evening. Reported on 09/21/2015 90 tablet Kahlee Metivier C, [name]    escitalopram (LEXAPRO) 10 MG tablet Take 1 tablet (10 mg total) by mouth daily. 90 tablet Lisabeth Mian C, [name]   amoxicillin-clavulanate (AUGMENTIN) 875-125 MG tablet Take 1 tablet by mouth every 12 (twelve) hours for 7 days. 14 tablet Emi Lymon C, [name]   predniSONE (DELTASONE) 20 MG tablet Take 2 tablets (40 mg total) by mouth daily for 5 days. 10 tablet Tamecia Mcdougald C, [name]   benzonatate (TESSALON) 200 MG capsule Take 1 capsule (200 mg total) by mouth 3 (three) times daily as needed for up to 7 days for cough. 28 capsule Carletta Feasel C, [name]   fluticasone (FLONASE) 50 MCG/ACT nasal spray Place 1-2 sprays into both nostrils daily for 7 days. 1 g Natisha Trzcinski, Callao C, [name]     PDMP not reviewed this encounter.   Lew Dawes, [place] 12/12/19 1559

## 2019-12-12 NOTE — ED Triage Notes (Signed)
Pt presents with complaints of drainage, runny nose, cough, and pressure in her ears x 2 weeks. Reports trying some otc medications with no relief. Pt has been vaccinated.

## 2020-04-23 ENCOUNTER — Ambulatory Visit (INDEPENDENT_AMBULATORY_CARE_PROVIDER_SITE_OTHER)
Admission: EM | Admit: 2020-04-23 | Discharge: 2020-04-23 | Disposition: A | Discharge status: Home / Self Care | Payer: BC Managed Care – PPO | Source: Home / Self Care

## 2020-04-23 ENCOUNTER — Encounter (HOSPITAL_COMMUNITY): Payer: Self-pay

## 2020-04-23 ENCOUNTER — Emergency Department (HOSPITAL_COMMUNITY): Payer: BC Managed Care – PPO

## 2020-04-23 ENCOUNTER — Emergency Department (HOSPITAL_COMMUNITY)
Admission: EM | Admit: 2020-04-23 | Discharge: 2020-04-23 | Disposition: A | Discharge status: Home / Self Care | Payer: BC Managed Care – PPO | Attending: Emergency Medicine | Admitting: Emergency Medicine

## 2020-04-23 ENCOUNTER — Encounter (HOSPITAL_COMMUNITY): Payer: Self-pay | Admitting: *Deleted

## 2020-04-23 ENCOUNTER — Other Ambulatory Visit: Payer: Self-pay

## 2020-04-23 DIAGNOSIS — I10 Essential (primary) hypertension: Secondary | ICD-10-CM | POA: Insufficient documentation

## 2020-04-23 DIAGNOSIS — F17210 Nicotine dependence, cigarettes, uncomplicated: Secondary | ICD-10-CM | POA: Diagnosis not present

## 2020-04-23 DIAGNOSIS — Z8719 Personal history of other diseases of the digestive system: Secondary | ICD-10-CM

## 2020-04-23 DIAGNOSIS — K5732 Diverticulitis of large intestine without perforation or abscess without bleeding: Secondary | ICD-10-CM | POA: Insufficient documentation

## 2020-04-23 DIAGNOSIS — Z9049 Acquired absence of other specified parts of digestive tract: Secondary | ICD-10-CM

## 2020-04-23 DIAGNOSIS — R1030 Lower abdominal pain, unspecified: Secondary | ICD-10-CM

## 2020-04-23 DIAGNOSIS — Z20822 Contact with and (suspected) exposure to covid-19: Secondary | ICD-10-CM | POA: Insufficient documentation

## 2020-04-23 DIAGNOSIS — R5381 Other malaise: Secondary | ICD-10-CM | POA: Insufficient documentation

## 2020-04-23 DIAGNOSIS — Z79899 Other long term (current) drug therapy: Secondary | ICD-10-CM | POA: Insufficient documentation

## 2020-04-23 DIAGNOSIS — K5792 Diverticulitis of intestine, part unspecified, without perforation or abscess without bleeding: Secondary | ICD-10-CM

## 2020-04-23 LAB — CBC
HCT: 45.4 % (ref 36.0–46.0)
Hemoglobin: 14.5 g/dL (ref 12.0–15.0)
MCH: 30 pg (ref 26.0–34.0)
MCHC: 31.9 g/dL (ref 30.0–36.0)
MCV: 93.8 fL (ref 80.0–100.0)
Platelets: 285 10*3/uL (ref 150–400)
RBC: 4.84 MIL/uL (ref 3.87–5.11)
RDW: 13.2 % (ref 11.5–15.5)
WBC: 11.5 10*3/uL — ABNORMAL HIGH (ref 4.0–10.5)
nRBC: 0 % (ref 0.0–0.2)

## 2020-04-23 LAB — COMPREHENSIVE METABOLIC PANEL
ALT: 16 U/L (ref 0–44)
AST: 19 U/L (ref 15–41)
Albumin: 4 g/dL (ref 3.5–5.0)
Alkaline Phosphatase: 95 U/L (ref 38–126)
Anion gap: 12 (ref 5–15)
BUN: 9 mg/dL (ref 6–20)
CO2: 25 mmol/L (ref 22–32)
Calcium: 9.4 mg/dL (ref 8.9–10.3)
Chloride: 99 mmol/L (ref 98–111)
Creatinine, Ser: 1.19 mg/dL — ABNORMAL HIGH (ref 0.44–1.00)
GFR, Estimated: 55 mL/min — ABNORMAL LOW (ref >60)
Glucose, Bld: 109 mg/dL — ABNORMAL HIGH (ref 70–99)
Potassium: 4 mmol/L (ref 3.5–5.1)
Sodium: 136 mmol/L (ref 135–145)
Total Bilirubin: 0.9 mg/dL (ref 0.3–1.2)
Total Protein: 7.2 g/dL (ref 6.5–8.1)

## 2020-04-23 LAB — LIPASE, BLOOD: Lipase: 20 U/L (ref 11–51)

## 2020-04-23 MED ORDER — HYDROCODONE-ACETAMINOPHEN 5-325 MG PO TABS
ORAL_TABLET | ORAL | Status: AC
  Filled 2020-04-23: qty 1

## 2020-04-23 MED ORDER — ONDANSETRON HCL 4 MG/2ML IJ SOLN
4.0000 mg | Freq: Once | INTRAMUSCULAR | Status: AC
  Administered 2020-04-23: 4 mg via INTRAVENOUS
  Filled 2020-04-23: qty 2

## 2020-04-23 MED ORDER — ONDANSETRON 4 MG PO TBDP
4.0000 mg | ORAL_TABLET | Freq: Once | ORAL | Status: AC
  Administered 2020-04-23: 4 mg via ORAL

## 2020-04-23 MED ORDER — ONDANSETRON 4 MG PO TBDP: 4.0000 mg | ORAL_TABLET | Freq: Three times a day (TID) | ORAL | 0 refills | Status: DC | PRN

## 2020-04-23 MED ORDER — SODIUM CHLORIDE 0.9 % IV BOLUS
1000.0000 mL | Freq: Once | INTRAVENOUS | Status: AC
  Administered 2020-04-23: 1000 mL via INTRAVENOUS

## 2020-04-23 MED ORDER — MORPHINE SULFATE (PF) 4 MG/ML IV SOLN
4.0000 mg | Freq: Once | INTRAVENOUS | Status: AC
  Administered 2020-04-23: 4 mg via INTRAVENOUS
  Filled 2020-04-23: qty 1

## 2020-04-23 MED ORDER — ONDANSETRON 4 MG PO TBDP: 8.0000 mg | ORAL_TABLET | Freq: Once | ORAL | Status: DC

## 2020-04-23 MED ORDER — ONDANSETRON 8 MG PO TBDP: 8.0000 mg | ORAL_TABLET | Freq: Three times a day (TID) | ORAL | 0 refills | Status: DC | PRN

## 2020-04-23 MED ORDER — HYDROCODONE-ACETAMINOPHEN 5-325 MG PO TABS
1.0000 | ORAL_TABLET | Freq: Once | ORAL | Status: AC
  Administered 2020-04-23: 1 via ORAL

## 2020-04-23 MED ORDER — AMOXICILLIN-POT CLAVULANATE 875-125 MG PO TABS: 1.0000 | ORAL_TABLET | Freq: Two times a day (BID) | ORAL | 0 refills | Status: DC

## 2020-04-23 MED ORDER — IOHEXOL 300 MG/ML  SOLN
100.0000 mL | Freq: Once | INTRAMUSCULAR | Status: AC | PRN
  Administered 2020-04-23: 100 mL via INTRAVENOUS

## 2020-04-23 MED ORDER — ONDANSETRON 4 MG PO TBDP
ORAL_TABLET | ORAL | Status: AC
  Filled 2020-04-23: qty 2

## 2020-04-23 NOTE — ED Provider Notes (Addendum)
[facility] EMERGENCY DEPARTMENT Provider Note   CSN: [id number] Arrival date & time: 04/23/20  1432     History Chief Complaint  Patient presents with  . Abdominal Pain    Kendra Becker is a 54 y.o. female with a past medical history of diverticulitis, hypertension presenting to the ED with a chief complaint of abdominal pain.  States that she had an appendectomy and ?perforation surgery about 20 years ago.  Since then she has had intermittent "gas related pain" throughout her abdomen.  Reports frequent episodes of diarrhea as well.  This episode started about 2 days ago and is located in her lower abdomen.  States that this is the first time that her pain has been this bad.  Reports several episodes of vomiting yesterday.  Denies any bloody stools or vomiting.  She took an antiemetic yesterday with improvement in her symptoms but her pain worsened today.  Still is able to pass gas.  No suspicious food intake.  No sick contacts with similar symptoms.  Reports difficulty with urination yesterday but denies any dysuria.  No fevers, chest pain, shortness of breath. Patient was evaluated at urgent care prior to arrival and was told to come to the ER for further work-up including imaging.  HPI     Past Medical History:  Diagnosis Date  . Anal warts   . History of cerebral aneurysm repair    03-17-2006 clipping anterior communicating artery segment, unruptured  . History of diverticulitis of colon   . Hypertension   . Wears glasses     Patient Active Problem List   Diagnosis Date Noted  . Diverticulitis 09/21/2015    Past Surgical History:  Procedure Laterality Date  . LAPAROSCOPIC CHOLECYSTECTOMY  01/18/2003  . LAPAROSCOPY APPENDECTOMY AND PARTIAL CECECTOMY  2000  . LASER ABLATION CONDOLAMATA N/A 05/20/2016   Procedure: EXCISION AND LASER ABLATION OF ANAL CONDYLOMA;  Surgeon: Avel Peace, MD;  Location: [facility];  Service: General;   Laterality: N/A;  . RIGHT PTERIONAL CRANIOTOMY CLIPPING ANTERIOR COMMUNICATING ARTERY SEGMENT ANEURYSM MICRODISSECTION  03/17/2006   unruptured     OB History   No obstetric history on file.     Family History  Problem Relation Age of Onset  . Hypertension Mother   . Aneurysm Father     Social History   Tobacco Use  . Smoking status: Current Every Day Smoker    Packs/day: 1.50    Years: 28.00    Pack years: 42.00    Types: Cigarettes  . Smokeless tobacco: Never Used  Substance Use Topics  . Alcohol use: Yes    Comment: occasional  . Drug use: No    Home Medications Prior to Admission medications   Medication Sig Start Date End Date Taking? Authorizing Provider  ALPRAZolam Prudy Feeler) 0.5 MG tablet Take 0.25 mg by mouth 2 (two) times daily as needed. Reported on 09/21/2015    [provider]  amoxicillin-clavulanate (AUGMENTIN) 875-125 MG tablet Take 1 tablet by mouth every 12 (twelve) hours. 04/23/20   Urania Pearlman, [name]  escitalopram (LEXAPRO) 10 MG tablet Take 1 tablet (10 mg total) by mouth daily. 12/12/19   Wieters, Hallie C, [name]  fluticasone (FLONASE) 50 MCG/ACT nasal spray Place 1-2 sprays into both nostrils daily for 7 days. 12/12/19 12/19/19  Wieters, Hallie C, [name]  lisinopril (ZESTRIL) 30 MG tablet Take 1 tablet (30 mg total) by mouth every evening. Reported on 09/21/2015 12/12/19   Lew Dawes, [name]  ondansetron (ZOFRAN ODT) 4 MG disintegrating tablet Take 1 tablet (4 mg total) by mouth every 8 (eight) hours as needed for nausea or vomiting. 04/23/20   Chrisha Vogel, [name]  valACYclovir (VALTREX) 1000 MG tablet Take 1 tablet (1,000 mg total) by mouth 3 (three) times daily. 01/06/18   Eustace Moore, MD    Allergies    Flagyl [metronidazole]  Review of Systems   Review of Systems  Constitutional: Negative for appetite change, chills and fever.  HENT: Negative for ear pain, rhinorrhea, sneezing and sore throat.   Eyes: Negative for photophobia and visual  disturbance.  Respiratory: Negative for cough, chest tightness, shortness of breath and wheezing.   Cardiovascular: Negative for chest pain and palpitations.  Gastrointestinal: Positive for abdominal pain, diarrhea, nausea and vomiting. Negative for blood in stool and constipation.  Genitourinary: Negative for dysuria, hematuria and urgency.  Musculoskeletal: Negative for myalgias.  Skin: Negative for rash.  Neurological: Negative for dizziness, weakness and light-headedness.    Physical Exam Updated Vital Signs BP (!) 161/148   Pulse 87   Temp 97.7 F (36.5 C)   Resp (!) 22   Ht 5\' 9"  (1.753 m)   Wt 74.8 kg   LMP 09/15/2011   SpO2 98%   BMI 24.35 kg/m   Physical Exam Vitals and nursing note reviewed.  Constitutional:      General: She is not in acute distress.    Appearance: She is well-developed.  HENT:     Head: Normocephalic and atraumatic.     Nose: Nose normal.  Eyes:     General: No scleral icterus.       Left eye: No discharge.     Conjunctiva/sclera: Conjunctivae normal.  Cardiovascular:     Rate and Rhythm: Normal rate and regular rhythm.     Heart sounds: Normal heart sounds. No murmur heard.  No friction rub. No gallop.   Pulmonary:     Effort: Pulmonary effort is normal. No respiratory distress.     Breath sounds: Normal breath sounds.  Abdominal:     General: Bowel sounds are normal. There is no distension.     Palpations: Abdomen is soft.     Tenderness: There is abdominal tenderness in the right lower quadrant, suprapubic area and left lower quadrant. There is no guarding.  Musculoskeletal:        General: Normal range of motion.     Cervical back: Normal range of motion and neck supple.  Skin:    General: Skin is warm and dry.     Findings: No rash.  Neurological:     Mental Status: She is alert.     Motor: No abnormal muscle tone.     Coordination: Coordination normal.     ED Results / Procedures / Treatments   Labs (all labs ordered are  listed, but only abnormal results are displayed) Labs Reviewed  COMPREHENSIVE METABOLIC PANEL - Abnormal; Notable for the following components:      Result Value   Glucose, Bld 109 (*)    Creatinine, Ser 1.19 (*)    GFR, Estimated 55 (*)    All other components within normal limits  CBC - Abnormal; Notable for the following components:   WBC 11.5 (*)    All other components within normal limits  LIPASE, BLOOD    EKG None  Radiology CT ABDOMEN PELVIS W CONTRAST  Result Date: 04/23/2020 CLINICAL DATA:  54 year old female with nausea, abdominal pain and bloating for 2 days. EXAM:  CT ABDOMEN AND PELVIS WITH CONTRAST TECHNIQUE: Multidetector CT imaging of the abdomen and pelvis was performed using the standard protocol following bolus administration of intravenous contrast. CONTRAST:  OMNIPAQUE IOHEXOL 300 MG/ML  SOLN COMPARISON:  CT Abdomen and Pelvis 09/21/2015. FINDINGS: Lower chest: Negative. Hepatobiliary: Remote cholecystectomy. Probable associated small surgical clip also along the greater omentum is unchanged (series 2, image 44). Negative liver. Bile ducts are stable. Pancreas: Negative. Spleen: Negative. Adrenals/Urinary Tract: Normal adrenal glands. Bilateral renal enhancement and contrast excretion is symmetric and within normal limits. Left ureter courses through the area of pelvic inflammation but appears to remain normal. No urinary calculus. Urinary bladder remains within normal limits. Stomach/Bowel: Negative rectum. Sigmoid diverticulosis with abnormal circumferential sigmoid wall thickening and surrounding mesenteric inflammatory stranding. See coronal image 93, series 2, image 69. A segment of 8 or 9 cm of the pelvic sigmoid is affected. No extraluminal gas identified. No organized or drainable fluid collection. Upstream bowel is nondilated. Descending, transverse, and right colon appear normal. Evidence of prior appendectomy. Negative terminal ileum. No dilated small bowel.  Negative stomach and duodenum. No free air, free fluid. Vascular/Lymphatic: Aortoiliac calcified atherosclerosis. Major arterial structures in the abdomen and pelvis remain patent. Portal venous system is patent. No lymphadenopathy. Reproductive: Mild mass effect on the uterus and adnexa related to abnormal sigmoid colon and inflammation, but otherwise within normal limits. Other: No free fluid in the pelvis. Musculoskeletal: Stable, negative. IMPRESSION: 1. 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 Regional inflammation with no perforation, abscess, or other complicating features. 2.  Aortic Atherosclerosis (ICD10-I70.0). Electronically Signed   By: Odessa Fleming M.D.   On: 04/23/2020 21:51    Procedures Procedures (including critical care time)  Medications Ordered in ED Medications  sodium chloride 0.9 % bolus 1,000 mL (1,000 mLs Intravenous New Bag/Given 04/23/20 2056)  ondansetron (ZOFRAN) injection 4 mg (4 mg Intravenous Given 04/23/20 2056)  morphine 4 MG/ML injection 4 mg (4 mg Intravenous Given 04/23/20 2111)  iohexol (OMNIPAQUE) 300 MG/ML solution 100 mL (100 mLs Intravenous Contrast Given 04/23/20 2138)    ED Course  I have reviewed the triage vital signs and the nursing notes.  Pertinent labs & imaging results that were available during my care of the patient were reviewed by me and considered in my medical decision making (see chart for details).    MDM Rules/Calculators/A&P                          54 year old female with a past medical history of diverticulosis, hypertension presenting to the ED with a chief complaint of abdominal pain.  Appendectomy with perforation requiring surgery about 20 years ago.  Reports gas related pain.  Since then.  Several episodes of vomiting yesterday.  No bloody stools or emesis.  Reports difficulty with urination but denies dysuria.  No fevers.  On exam she has some tenderness of the lower abdomen without rebound or guarding.  She was told to come  to the ER from urgent care for CT.  Work-up here significant for leukocytosis of 11.5, normal lipase, CMP with slight elevation in creatinine she was given IV fluids.  Symptoms controlled here.  CT of the abdomen pelvis shows findings consistent with acute uncomplicated sigmoid diverticulitis.  She remains hemodynamically stable here.  Her symptoms have improved.  Will treat with antibiotics as I suspect this is the cause of her symptoms today.  We will have her follow-up with her PCP and return  for worsening symptoms.  Patient is hemodynamically stable, in NAD, and able to ambulate in the ED. Evaluation does not show pathology that would require ongoing emergent intervention or inpatient treatment. I explained the diagnosis to the patient. Pain has been managed and has no complaints prior to discharge. Patient is comfortable with above plan and is stable for discharge at this time. All questions were answered prior to disposition. Strict return precautions for returning to the ED were discussed. Encouraged follow up with PCP.   An After Visit Summary was printed and given to the patient.   Portions of this note were generated with Scientist, clinical (histocompatibility and immunogenetics)Dragon dictation software. Dictation errors may occur despite best attempts at proofreading.  Final Clinical Impression(s) / ED Diagnoses Final diagnoses:  Diverticulitis    Rx / DC Orders ED Discharge Orders         Ordered    amoxicillin-clavulanate (AUGMENTIN) 875-125 MG tablet  Every 12 hours        04/23/20 2209    ondansetron (ZOFRAN ODT) 4 MG disintegrating tablet  Every 8 hours PRN        04/23/20 2209             Dietrich PatesKhatri, Kree Rafter, [name] 04/23/20 2313    Gerhard MunchLockwood, Robert, MD 04/25/20 1026

## 2020-04-23 NOTE — Discharge Instructions (Signed)
Take the antibiotics as directed. Follow-up with your primary care provider. Take the Zofran as needed to help with your nausea. Return to the ER if you start to experience worsening pain, fever,

## 2020-04-23 NOTE — ED Notes (Signed)
Patient verbalizes understanding of discharge instructions. Opportunity for questioning and answers were provided. Armband removed by staff, pt discharged from ED ambulatory to home.  

## 2020-04-23 NOTE — ED Triage Notes (Signed)
Pt sent from urgent care with abd pain for 2 days  The pt reports that the urgent care doctor wants a c-t scan done   Nausea no vomiting diarrhea.   lmp none

## 2020-04-23 NOTE — ED Notes (Signed)
Patient is being discharged from the Urgent Care and sent to the Emergency Department via POV Per Delle Reining, patient is in need of higher level of care due to CT for abdominal pain, n/v. Patient is aware and verbalizes understanding of plan of care. Instructed to remain NPO until cleared by ED staff Vitals:   04/23/20 1301  BP: (!) 143/104  Pulse: (!) 105  Resp: 19  Temp: 98.5 F (36.9 C)  SpO2: 97%

## 2020-04-23 NOTE — ED Triage Notes (Signed)
Pt presents with complaints of nausea, abdominal pain, and abdominal bloating x 2 days. Reports history of bloating with certain foods. Pt took zofran at home, does not have anymore, with relief. Pt also complaints of headache and cold chills.

## 2020-04-23 NOTE — ED Provider Notes (Signed)
[facility]   MRN: [id number] DOB: 1965/10/23  Subjective:   Kendra Becker is a 54 y.o. female presenting for 2 day hx of acute onset abdominal pain and bloating with nausea. Has a hx of appendectomy and cecectomy in 2000. Has had diverticulitis as well. Last colonoscopy was ~2 years ago and showed internal hemorrhoids. No bloody stools or fever. Has had general malaise and chills.   No current facility-administered medications for this encounter.  Current Outpatient Medications:  .  ALPRAZolam (XANAX) 0.5 MG tablet, Take 0.25 mg by mouth 2 (two) times daily as needed. Reported on 09/21/2015, Disp: , Rfl:  .  escitalopram (LEXAPRO) 10 MG tablet, Take 1 tablet (10 mg total) by mouth daily., Disp: 90 tablet, Rfl: 0 .  fluticasone (FLONASE) 50 MCG/ACT nasal spray, Place 1-2 sprays into both nostrils daily for 7 days., Disp: 1 g, Rfl: 0 .  lisinopril (ZESTRIL) 30 MG tablet, Take 1 tablet (30 mg total) by mouth every evening. Reported on 09/21/2015, Disp: 90 tablet, Rfl: 0 .  valACYclovir (VALTREX) 1000 MG tablet, Take 1 tablet (1,000 mg total) by mouth 3 (three) times daily., Disp: 21 tablet, Rfl: 0   Allergies  Allergen Reactions  . Flagyl [Metronidazole] Itching    redness    Past Medical History:  Diagnosis Date  . Anal warts   . History of cerebral aneurysm repair    03-17-2006 clipping anterior communicating artery segment, unruptured  . History of diverticulitis of colon   . Hypertension   . Wears glasses      Past Surgical History:  Procedure Laterality Date  . LAPAROSCOPIC CHOLECYSTECTOMY  01/18/2003  . LAPAROSCOPY APPENDECTOMY AND PARTIAL CECECTOMY  2000  . LASER ABLATION CONDOLAMATA N/A 05/20/2016   Procedure: EXCISION AND LASER ABLATION OF ANAL CONDYLOMA;  Surgeon: Avel Peace, MD;  Location: [facility];  Service: General;  Laterality: N/A;  . RIGHT PTERIONAL CRANIOTOMY CLIPPING ANTERIOR COMMUNICATING ARTERY SEGMENT ANEURYSM  MICRODISSECTION  03/17/2006   unruptured    Family History  Problem Relation Age of Onset  . Hypertension Mother   . Aneurysm Father     Social History   Tobacco Use  . Smoking status: Current Every Day Smoker    Packs/day: 1.50    Years: 28.00    Pack years: 42.00    Types: Cigarettes  . Smokeless tobacco: Never Used  Substance Use Topics  . Alcohol use: Yes    Comment: occasional  . Drug use: No    ROS   Objective:   Vitals: BP (!) 143/104   Pulse (!) 105   Temp 98.5 F (36.9 C)   Resp 19   LMP 09/15/2011   SpO2 97%   Physical Exam Constitutional:      General: She is not in acute distress.    Appearance: Normal appearance. She is well-developed. She is not ill-appearing.  HENT:     Head: Normocephalic and atraumatic.     Nose: Nose normal.     Mouth/Throat:     Mouth: Mucous membranes are moist.     Pharynx: Oropharynx is clear.  Eyes:     General: No scleral icterus.    Extraocular Movements: Extraocular movements intact.     Pupils: Pupils are equal, round, and reactive to light.  Cardiovascular:     Rate and Rhythm: Normal rate.  Pulmonary:     Effort: Pulmonary effort is normal.  Abdominal:     General: There is distension.  Tenderness: There is abdominal tenderness in the right lower quadrant, periumbilical area, suprapubic area and left lower quadrant. There is guarding and rebound.  Skin:    General: Skin is warm and dry.  Neurological:     General: No focal deficit present.     Mental Status: She is alert and oriented to person, place, and time.  Psychiatric:        Mood and Affect: Mood normal.        Behavior: Behavior normal.    PO Zofran and hydrocodone given for nausea, pain.    Assessment and Plan :   PDMP not reviewed this encounter.  1. Lower abdominal pain   2. History of diverticulitis   3. History of colon resection   4. History of appendectomy     Patient has very concerning physical exam findings. Emphasized  need for CT abdomen today to rule out acute abdomen, acute recurrent diverticulitis. COVID testing pending as required by her employer. Patient is agreeable and will report to the emergency room now.    Wallis Bamberg, [name] 04/23/20 1337

## 2020-04-23 NOTE — Discharge Instructions (Signed)
Given your exam, abdominal guarding, rebound tenderness, abdominal pain and history of diverticulitis, you will need a CT scan of your abdomen to rule out an acute abdomen, acute diverticulitis, infectious colitis. This can only be done in the emergency room so please head there now.

## 2020-04-24 LAB — SARS CORONAVIRUS 2 (TAT 6-24 HRS): SARS Coronavirus 2: NEGATIVE

## 2020-07-16 ENCOUNTER — Encounter (HOSPITAL_COMMUNITY): Payer: Self-pay | Admitting: *Deleted

## 2020-07-16 ENCOUNTER — Other Ambulatory Visit: Payer: Self-pay

## 2020-07-16 ENCOUNTER — Ambulatory Visit (HOSPITAL_COMMUNITY)
Admission: EM | Admit: 2020-07-16 | Discharge: 2020-07-16 | Disposition: A | Discharge status: Home / Self Care | Payer: BC Managed Care – PPO | Attending: Family Medicine | Admitting: Family Medicine

## 2020-07-16 DIAGNOSIS — R0602 Shortness of breath: Secondary | ICD-10-CM | POA: Insufficient documentation

## 2020-07-16 DIAGNOSIS — J069 Acute upper respiratory infection, unspecified: Secondary | ICD-10-CM | POA: Diagnosis present

## 2020-07-16 DIAGNOSIS — F17210 Nicotine dependence, cigarettes, uncomplicated: Secondary | ICD-10-CM | POA: Diagnosis not present

## 2020-07-16 DIAGNOSIS — J3089 Other allergic rhinitis: Secondary | ICD-10-CM | POA: Diagnosis not present

## 2020-07-16 DIAGNOSIS — J45909 Unspecified asthma, uncomplicated: Secondary | ICD-10-CM | POA: Diagnosis not present

## 2020-07-16 DIAGNOSIS — Z20822 Contact with and (suspected) exposure to covid-19: Secondary | ICD-10-CM | POA: Diagnosis not present

## 2020-07-16 HISTORY — DX: Unspecified asthma, uncomplicated: J45.909

## 2020-07-16 LAB — SARS CORONAVIRUS 2 (TAT 6-24 HRS): SARS Coronavirus 2: NEGATIVE

## 2020-07-16 MED ORDER — ALBUTEROL SULFATE HFA 108 (90 BASE) MCG/ACT IN AERS
2.0000 | INHALATION_SPRAY | Freq: Once | RESPIRATORY_TRACT | Status: AC
  Administered 2020-07-16: 2 via RESPIRATORY_TRACT

## 2020-07-16 MED ORDER — ALBUTEROL SULFATE HFA 108 (90 BASE) MCG/ACT IN AERS
INHALATION_SPRAY | RESPIRATORY_TRACT | Status: AC
  Filled 2020-07-16: qty 6.7

## 2020-07-16 MED ORDER — CETIRIZINE HCL 10 MG PO TABS: 10.0000 mg | ORAL_TABLET | Freq: Every day | ORAL | 1 refills | Status: AC

## 2020-07-16 MED ORDER — BENZONATATE 200 MG PO CAPS: 200.0000 mg | ORAL_CAPSULE | Freq: Three times a day (TID) | ORAL | 0 refills | Status: DC | PRN

## 2020-07-16 NOTE — ED Triage Notes (Signed)
Patient reports HA, body aches, sore throat, nasal and throat congestion x 3 days.    She took home covid test yesterday and this was negative.  No fever.   No SOB.

## 2020-07-16 NOTE — ED Provider Notes (Signed)
[facility]    CSN: [id number] Arrival date & time: 07/16/20  1303      History   Chief Complaint Chief Complaint  Patient presents with  . Sore Throat  . Headache    HPI Kendra Becker is a 55 y.o. female.   Patient here today with 3-day history of cough, intermittent mild shortness of breath, sore throat, headache, fever, chills, body aches.  She denies chest pain, significant shortness of breath, abdominal pain, nausea vomiting diarrhea.  Recently had a Covid positive work exposure.  Smokes cigarettes daily, states she has been having more regular shortness of breath and thinks she might be developing asthma.  Also has some ongoing issues with ear pressure and popping, sinus pressure and wonders if she needs allergy medication.     Past Medical History:  Diagnosis Date  . Anal warts   . Asthma   . History of cerebral aneurysm repair    03-17-2006 clipping anterior communicating artery segment, unruptured  . History of diverticulitis of colon   . Hypertension   . Wears glasses     Patient Active Problem List   Diagnosis Date Noted  . Diverticulitis 09/21/2015    Past Surgical History:  Procedure Laterality Date  . LAPAROSCOPIC CHOLECYSTECTOMY  01/18/2003  . LAPAROSCOPY APPENDECTOMY AND PARTIAL CECECTOMY  2000  . LASER ABLATION CONDOLAMATA N/A 05/20/2016   Procedure: EXCISION AND LASER ABLATION OF ANAL CONDYLOMA;  Surgeon: Avel Peace, MD;  Location: [facility];  Service: General;  Laterality: N/A;  . RIGHT PTERIONAL CRANIOTOMY CLIPPING ANTERIOR COMMUNICATING ARTERY SEGMENT ANEURYSM MICRODISSECTION  03/17/2006   unruptured    OB History   No obstetric history on file.      Home Medications    Prior to Admission medications   Medication Sig Start Date End Date Taking? Authorizing Provider  ALPRAZolam Prudy Feeler) 0.5 MG tablet Take 0.25 mg by mouth 2 (two) times daily as needed. Reported on 09/21/2015   Yes [provider]   benzonatate (TESSALON) 200 MG capsule Take 1 capsule (200 mg total) by mouth 3 (three) times daily as needed for cough. 07/16/20  Yes Particia Nearing, [name]  cetirizine (ZYRTEC ALLERGY) 10 MG tablet Take 1 tablet (10 mg total) by mouth daily. 07/16/20  Yes Particia Nearing, [name]  escitalopram (LEXAPRO) 10 MG tablet Take 1 tablet (10 mg total) by mouth daily. 12/12/19  Yes Wieters, Hallie C, [name]  lisinopril (ZESTRIL) 30 MG tablet Take 1 tablet (30 mg total) by mouth every evening. Reported on 09/21/2015 12/12/19  Yes Wieters, Hallie C, [name]  amoxicillin-clavulanate (AUGMENTIN) 875-125 MG tablet Take 1 tablet by mouth every 12 (twelve) hours. 04/23/20   Khatri, Hina, [name]  fluticasone (FLONASE) 50 MCG/ACT nasal spray Place 1-2 sprays into both nostrils daily for 7 days. 12/12/19 12/19/19  Wieters, Hallie C, [name]  ondansetron (ZOFRAN ODT) 4 MG disintegrating tablet Take 1 tablet (4 mg total) by mouth every 8 (eight) hours as needed for nausea or vomiting. 04/23/20   Khatri, Hina, [name]  valACYclovir (VALTREX) 1000 MG tablet Take 1 tablet (1,000 mg total) by mouth 3 (three) times daily. 01/06/18   Eustace Moore, MD    Family History Family History  Problem Relation Age of Onset  . Hypertension Mother   . Aneurysm Father     Social History Social History   Tobacco Use  . Smoking status: Current Every Day Smoker    Packs/day: 1.50    Years: 28.00  Pack years: 42.00    Types: Cigarettes  . Smokeless tobacco: Never Used  Substance Use Topics  . Alcohol use: Yes    Comment: occasional  . Drug use: No     Allergies   Flagyl [metronidazole]   Review of Systems Review of Systems Per HPI  Physical Exam Triage Vital Signs ED Triage Vitals [07/16/20 1324]  Enc Vitals Group     BP (!) 149/100     Pulse Rate 78     Resp 18     Temp 98.3 F (36.8 C)     Temp Source Oral     SpO2 96 %     Weight      Height      Head Circumference      Peak Flow      Pain Score 6      Pain Loc      Pain Edu?      Excl. in GC?    No data found.  Updated Vital Signs BP (!) 149/100 (BP Location: Left Arm)   Pulse 78   Temp 98.3 F (36.8 C) (Oral)   Resp 18   LMP 09/15/2011   SpO2 96%   Visual Acuity Right Eye Distance:   Left Eye Distance:   Bilateral Distance:    Right Eye Near:   Left Eye Near:    Bilateral Near:     Physical Exam Vitals and nursing note reviewed.  Constitutional:      Appearance: Normal appearance. She is not ill-appearing.  HENT:     Head: Atraumatic.     Right Ear: Tympanic membrane is not erythematous.     Left Ear: Tympanic membrane is not erythematous.     Ears:     Comments: Mild bilateral middle ear effusion    Nose: Rhinorrhea present.     Mouth/Throat:     Mouth: Mucous membranes are moist.     Pharynx: Oropharynx is clear. No oropharyngeal exudate or posterior oropharyngeal erythema.  Eyes:     Extraocular Movements: Extraocular movements intact.     Conjunctiva/sclera: Conjunctivae normal.  Cardiovascular:     Rate and Rhythm: Normal rate and regular rhythm.     Heart sounds: Normal heart sounds.  Pulmonary:     Effort: Pulmonary effort is normal. No respiratory distress.     Breath sounds: Normal breath sounds. No wheezing or rales.  Abdominal:     General: Bowel sounds are normal.     Palpations: Abdomen is soft.     Tenderness: There is no abdominal tenderness. There is no guarding.  Musculoskeletal:        General: Normal range of motion.     Cervical back: Normal range of motion and neck supple.  Skin:    General: Skin is warm and dry.     Findings: No rash.  Neurological:     Mental Status: She is alert and oriented to person, place, and time.  Psychiatric:        Mood and Affect: Mood normal.        Thought Content: Thought content normal.        Judgment: Judgment normal.     UC Treatments / Results  Labs (all labs ordered are listed, but only abnormal results are displayed) Labs Reviewed   SARS CORONAVIRUS 2 (TAT 6-24 HRS)    EKG   Radiology No results found.  Procedures Procedures (including critical care time)  Medications Ordered in UC Medications  albuterol (VENTOLIN  HFA) 108 (90 Base) MCG/ACT inhaler 2 puff (has no administration in time range)    Initial Impression / Assessment and Plan / UC Course  I have reviewed the triage vital signs and the nursing notes.  Pertinent labs & imaging results that were available during my care of the patient were reviewed by me and considered in my medical decision making (see chart for details).     Examined vitals reassuring today, Covid PCR pending.  Work note given with isolation instructions.  Albuterol inhaler administered in clinic for patient to take home and use as needed.  Will start Zyrtec daily for suspected seasonal allergies and Tessalon Perles as needed for cough.  Over-the-counter remedies and supportive care reviewed.  Return for acutely worsening symptoms.  Follow-up as scheduled PCP on 3/17. Final Clinical Impressions(s) / UC Diagnoses   Final diagnoses:  Viral URI with cough  SOB (shortness of breath)  Seasonal allergic rhinitis due to other allergic trigger   Discharge Instructions   None    ED Prescriptions    Medication Sig Dispense Auth. Provider   cetirizine (ZYRTEC ALLERGY) 10 MG tablet Take 1 tablet (10 mg total) by mouth daily. 30 tablet Particia Nearing, [place]   benzonatate (TESSALON) 200 MG capsule Take 1 capsule (200 mg total) by mouth 3 (three) times daily as needed for cough. 20 capsule Particia Nearing, [place]     PDMP not reviewed this encounter.   Particia Nearing, [place] 07/16/20 1408

## 2020-12-26 ENCOUNTER — Encounter (HOSPITAL_COMMUNITY): Payer: Self-pay

## 2020-12-26 ENCOUNTER — Other Ambulatory Visit: Payer: Self-pay

## 2020-12-26 ENCOUNTER — Ambulatory Visit (HOSPITAL_COMMUNITY)
Admission: EM | Admit: 2020-12-26 | Discharge: 2020-12-26 | Disposition: A | Discharge status: Home / Self Care | Payer: BC Managed Care – PPO | Attending: Student | Admitting: Student

## 2020-12-26 DIAGNOSIS — I10 Essential (primary) hypertension: Secondary | ICD-10-CM | POA: Insufficient documentation

## 2020-12-26 DIAGNOSIS — Z112 Encounter for screening for other bacterial diseases: Secondary | ICD-10-CM | POA: Insufficient documentation

## 2020-12-26 DIAGNOSIS — J069 Acute upper respiratory infection, unspecified: Secondary | ICD-10-CM | POA: Insufficient documentation

## 2020-12-26 DIAGNOSIS — Z1152 Encounter for screening for COVID-19: Secondary | ICD-10-CM | POA: Diagnosis present

## 2020-12-26 LAB — POCT RAPID STREP A, ED / UC: Streptococcus, Group A Screen (Direct): NEGATIVE

## 2020-12-26 LAB — SARS CORONAVIRUS 2 (TAT 6-24 HRS): SARS Coronavirus 2: POSITIVE — AB

## 2020-12-26 MED ORDER — PROMETHAZINE-DM 6.25-15 MG/5ML PO SYRP: 5.0000 mL | ORAL_SOLUTION | Freq: Four times a day (QID) | ORAL | 0 refills | Status: DC | PRN

## 2020-12-26 NOTE — ED Provider Notes (Signed)
[facility]    CSN: [id number] Arrival date & time: 12/26/20  1324      History   Chief Complaint Chief Complaint  Patient presents with   URI   Emesis   Diarrhea   Headache    HPI Kendra Becker is a 55 y.o. female presenting with viral symptoms. Medical history asthma, diverticulitis.  Notes 2 days of hacking nonproductive cough, nausea with vomiting, diarrhea, throbbing headache behind forehead.  Describes 3 episodes of bilious vomiting today.  Tolerating fluids but decreased appetite.  Generalized crampy abdominal pain.  Zofran providing relief.  Ibuprofen helping with the sore throat.  Denies chest pain, dizziness, shortness of breath, weakness.  HPI  Past Medical History:  Diagnosis Date   Anal warts    Asthma    History of cerebral aneurysm repair    03-17-2006 clipping anterior communicating artery segment, unruptured   History of diverticulitis of colon    Hypertension    Wears glasses     Patient Active Problem List   Diagnosis Date Noted   Diverticulitis 09/21/2015    Past Surgical History:  Procedure Laterality Date   LAPAROSCOPIC CHOLECYSTECTOMY  01/18/2003   LAPAROSCOPY APPENDECTOMY AND PARTIAL CECECTOMY  2000   LASER ABLATION CONDOLAMATA N/A 05/20/2016   Procedure: EXCISION AND LASER ABLATION OF ANAL CONDYLOMA;  Surgeon: Avel Peace, MD;  Location: [facility];  Service: General;  Laterality: N/A;   RIGHT PTERIONAL CRANIOTOMY CLIPPING ANTERIOR COMMUNICATING ARTERY SEGMENT ANEURYSM MICRODISSECTION  03/17/2006   unruptured    OB History   No obstetric history on file.      Home Medications    Prior to Admission medications   Medication Sig Start Date End Date Taking? Authorizing Provider  promethazine-dextromethorphan (PROMETHAZINE-DM) 6.25-15 MG/5ML syrup Take 5 mLs by mouth 4 (four) times daily as needed for cough. 12/26/20  Yes Rhys Martini, [name]  ALPRAZolam Prudy Feeler) 0.5 MG tablet Take 0.25 mg by mouth 2 (two)  times daily as needed. Reported on 09/21/2015    [provider]  benzonatate (TESSALON) 200 MG capsule Take 1 capsule (200 mg total) by mouth 3 (three) times daily as needed for cough. 07/16/20   Particia Nearing, [name]  cetirizine (ZYRTEC ALLERGY) 10 MG tablet Take 1 tablet (10 mg total) by mouth daily. 07/16/20   Particia Nearing, [name]  escitalopram (LEXAPRO) 10 MG tablet Take 1 tablet (10 mg total) by mouth daily. 12/12/19   Wieters, Hallie C, [name]  fluticasone (FLONASE) 50 MCG/ACT nasal spray Place 1-2 sprays into both nostrils daily for 7 days. 12/12/19 12/19/19  Wieters, Hallie C, [name]  lisinopril (ZESTRIL) 30 MG tablet Take 1 tablet (30 mg total) by mouth every evening. Reported on 09/21/2015 12/12/19   Wieters, Hallie C, [name]  ondansetron (ZOFRAN ODT) 4 MG disintegrating tablet Take 1 tablet (4 mg total) by mouth every 8 (eight) hours as needed for nausea or vomiting. 04/23/20   Khatri, Hina, [name]  valACYclovir (VALTREX) 1000 MG tablet Take 1 tablet (1,000 mg total) by mouth 3 (three) times daily. 01/06/18   Eustace Moore, MD    Family History Family History  Problem Relation Age of Onset   Hypertension Mother    Aneurysm Father     Social History Social History   Tobacco Use   Smoking status: Every Day    Packs/day: 1.50    Years: 28.00    Pack years: 42.00    Types: Cigarettes   Smokeless tobacco: Never  Substance Use Topics   Alcohol use: Yes    Comment: occasional   Drug use: No     Allergies   Flagyl [metronidazole]   Review of Systems Review of Systems  Constitutional:  Positive for chills. Negative for appetite change and fever.  HENT:  Positive for congestion and sore throat. Negative for ear pain, rhinorrhea, sinus pressure and sinus pain.   Eyes:  Negative for redness and visual disturbance.  Respiratory:  Positive for cough. Negative for chest tightness, shortness of breath and wheezing.   Cardiovascular:  Negative for chest pain and  palpitations.  Gastrointestinal:  Positive for diarrhea, nausea and vomiting. Negative for abdominal pain and constipation.  Genitourinary:  Negative for dysuria, frequency and urgency.  Musculoskeletal:  Positive for myalgias.  Neurological:  Negative for dizziness, weakness and headaches.  Psychiatric/Behavioral:  Negative for confusion.   All other systems reviewed and are negative.   Physical Exam Triage Vital Signs ED Triage Vitals  Enc Vitals Group     BP 12/26/20 1448 (!) 146/102     Pulse Rate 12/26/20 1448 98     Resp 12/26/20 1448 17     Temp 12/26/20 1448 99.2 F (37.3 C)     Temp Source 12/26/20 1448 Oral     SpO2 12/26/20 1448 98 %     Weight --      Height --      Head Circumference --      Peak Flow --      Pain Score 12/26/20 1449 7     Pain Loc --      Pain Edu? --      Excl. in GC? --    No data found.  Updated Vital Signs BP (!) 146/102 (BP Location: Right Arm)   Pulse 98   Temp 99.2 F (37.3 C) (Oral)   Resp 17   LMP 09/15/2011   SpO2 98%   Visual Acuity Right Eye Distance:   Left Eye Distance:   Bilateral Distance:    Right Eye Near:   Left Eye Near:    Bilateral Near:     Physical Exam Vitals reviewed.  Constitutional:      General: She is not in acute distress.    Appearance: Normal appearance. She is ill-appearing.  HENT:     Head: Normocephalic and atraumatic.     Right Ear: Tympanic membrane, ear canal and external ear normal. No tenderness. No middle ear effusion. There is no impacted cerumen. Tympanic membrane is not perforated, erythematous, retracted or bulging.     Left Ear: Tympanic membrane, ear canal and external ear normal. No tenderness.  No middle ear effusion. There is no impacted cerumen. Tympanic membrane is not perforated, erythematous, retracted or bulging.     Nose: Nose normal. No congestion.     Mouth/Throat:     Mouth: Mucous membranes are moist.     Pharynx: Uvula midline. Posterior oropharyngeal erythema  present. No oropharyngeal exudate.     Tonsils: 1+ on the right. 1+ on the left.     Comments: Smooth erythema posterior pharynx On exam, uvula is midline, she is tolerating her secretions without difficulty, there is no trismus, no drooling, she has normal phonation  Eyes:     Extraocular Movements: Extraocular movements intact.     Pupils: Pupils are equal, round, and reactive to light.  Cardiovascular:     Rate and Rhythm: Normal rate and regular rhythm.     Heart sounds: Normal heart sounds.  Pulmonary:     Effort: Pulmonary effort is normal.     Breath sounds: Normal breath sounds. No decreased breath sounds, wheezing, rhonchi or rales.  Abdominal:     Palpations: Abdomen is soft.     Tenderness: There is no abdominal tenderness. There is no guarding or rebound.  Lymphadenopathy:     Cervical: Cervical adenopathy present.     Right cervical: Superficial cervical adenopathy present.     Left cervical: Superficial cervical adenopathy present.  Neurological:     General: No focal deficit present.     Mental Status: She is alert and oriented to person, place, and time.  Psychiatric:        Mood and Affect: Mood normal.        Behavior: Behavior normal.        Thought Content: Thought content normal.        Judgment: Judgment normal.     UC Treatments / Results  Labs (all labs ordered are listed, but only abnormal results are displayed) Labs Reviewed  CULTURE, GROUP A STREP ([facility])  SARS CORONAVIRUS 2 (TAT 6-24 HRS)  POCT RAPID STREP A, ED / UC    EKG   Radiology No results found.  Procedures Procedures (including critical care time)  Medications Ordered in UC Medications - No data to display  Initial Impression / Assessment and Plan / UC Course  I have reviewed the triage vital signs and the nursing notes.  Pertinent labs & imaging results that were available during my care of the patient were reviewed by me and considered in my medical decision making (see  chart for details).     This patient is a very pleasant 55 y.o. year old female presenting with viral illness- suspected covid-19. Today this pt is afebrile nontachycardic nontachypneic, oxygenating well on room air, no wheezes rhonchi or rales.   Rapid strep negative, culture sent Covid PCR sent.   Symptomatic relief with promethazine DM, zofran, tylenol/ibuprofen.  BP- continue current regimen.  ED return precautions discussed. Patient verbalizes understanding and agreement.    Final Clinical Impressions(s) / UC Diagnoses   Final diagnoses:  Viral URI with cough  Essential hypertension  Encounter for screening for COVID-19  Screening for streptococcal infection     Discharge Instructions      -Promethazine DM cough syrup for congestion/cough. This could make you drowsy, so take at night before bed. -For fevers/chills, bodyaches, headaches- -You can take Tylenol up to 1000 mg 3 times daily, and ibuprofen up to 600 mg 3 times daily with food.  You can take these together, or alternate every 3-4 hours. -Continue Zofran. You can add imodium for diarrhea.  -Drink plenty of fluids and eat bland food -With a virus, you're typically contagious for 5-7 days, or as long as you're having fevers.  -Please check your blood pressure at home or at the pharmacy. If this continues to be >140/90, follow-up with your primary care provider for further blood pressure management/ medication titration. If you develop chest pain, shortness of breath, vision changes, the worst headache of your life- head straight to the ED or call 911.        ED Prescriptions     Medication Sig Dispense Auth. Provider   promethazine-dextromethorphan (PROMETHAZINE-DM) 6.25-15 MG/5ML syrup Take 5 mLs by mouth 4 (four) times daily as needed for cough. 118 mL Rhys Martini, [name]      PDMP not reviewed this encounter.   Rhys Martini, [name] 12/26/20 [phone]

## 2020-12-26 NOTE — ED Triage Notes (Signed)
Pt presents with generalized body aches, sore throat, headache, nausea, chills, and diarrhea X 2 days

## 2020-12-26 NOTE — Discharge Instructions (Addendum)
-  Promethazine DM cough syrup for congestion/cough. This could make you drowsy, so take at night before bed. -For fevers/chills, bodyaches, headaches- -You can take Tylenol up to 1000 mg 3 times daily, and ibuprofen up to 600 mg 3 times daily with food.  You can take these together, or alternate every 3-4 hours. -Continue Zofran. You can add imodium for diarrhea.  -Drink plenty of fluids and eat bland food -With a virus, you're typically contagious for 5-7 days, or as long as you're having fevers.  -Please check your blood pressure at home or at the pharmacy. If this continues to be >140/90, follow-up with your primary care provider for further blood pressure management/ medication titration. If you develop chest pain, shortness of breath, vision changes, the worst headache of your life- head straight to the ED or call 911.

## 2020-12-29 LAB — CULTURE, GROUP A STREP (THRC)

## 2021-05-21 ENCOUNTER — Other Ambulatory Visit: Payer: Self-pay | Admitting: Family Medicine

## 2021-05-21 NOTE — Telephone Encounter (Signed)
Provider is not at this office-refused.

## 2021-09-10 ENCOUNTER — Emergency Department (HOSPITAL_COMMUNITY)
Admission: EM | Admit: 2021-09-10 | Discharge: 2021-09-10 | Disposition: A | Discharge status: Home / Self Care | Payer: BC Managed Care – PPO | Attending: Emergency Medicine | Admitting: Emergency Medicine

## 2021-09-10 ENCOUNTER — Other Ambulatory Visit: Payer: Self-pay

## 2021-09-10 ENCOUNTER — Emergency Department (HOSPITAL_COMMUNITY): Payer: BC Managed Care – PPO

## 2021-09-10 DIAGNOSIS — R1013 Epigastric pain: Secondary | ICD-10-CM | POA: Insufficient documentation

## 2021-09-10 DIAGNOSIS — R1030 Lower abdominal pain, unspecified: Secondary | ICD-10-CM | POA: Diagnosis not present

## 2021-09-10 DIAGNOSIS — R197 Diarrhea, unspecified: Secondary | ICD-10-CM | POA: Diagnosis not present

## 2021-09-10 DIAGNOSIS — R112 Nausea with vomiting, unspecified: Secondary | ICD-10-CM | POA: Insufficient documentation

## 2021-09-10 DIAGNOSIS — K2970 Gastritis, unspecified, without bleeding: Secondary | ICD-10-CM

## 2021-09-10 LAB — COMPREHENSIVE METABOLIC PANEL
ALT: 12 U/L (ref 0–44)
AST: 14 U/L — ABNORMAL LOW (ref 15–41)
Albumin: 3.8 g/dL (ref 3.5–5.0)
Alkaline Phosphatase: 99 U/L (ref 38–126)
Anion gap: 7 (ref 5–15)
BUN: 6 mg/dL (ref 6–20)
CO2: 27 mmol/L (ref 22–32)
Calcium: 9 mg/dL (ref 8.9–10.3)
Chloride: 105 mmol/L (ref 98–111)
Creatinine, Ser: 0.99 mg/dL (ref 0.44–1.00)
GFR, Estimated: >60 mL/min (ref >60)
Glucose, Bld: 101 mg/dL — ABNORMAL HIGH (ref 70–99)
Potassium: 3.9 mmol/L (ref 3.5–5.1)
Sodium: 139 mmol/L (ref 135–145)
Total Bilirubin: 0.3 mg/dL (ref 0.3–1.2)
Total Protein: 7 g/dL (ref 6.5–8.1)

## 2021-09-10 LAB — URINALYSIS, ROUTINE W REFLEX MICROSCOPIC
Bacteria, UA: NONE SEEN
Bilirubin Urine: NEGATIVE
Glucose, UA: NEGATIVE mg/dL
Ketones, ur: NEGATIVE mg/dL
Leukocytes,Ua: NEGATIVE
Nitrite: NEGATIVE
Protein, ur: NEGATIVE mg/dL
Specific Gravity, Urine: 1.005 (ref 1.005–1.030)
pH: 5 (ref 5.0–8.0)

## 2021-09-10 LAB — CBC
HCT: 42 % (ref 36.0–46.0)
Hemoglobin: 13.4 g/dL (ref 12.0–15.0)
MCH: 30.3 pg (ref 26.0–34.0)
MCHC: 31.9 g/dL (ref 30.0–36.0)
MCV: 95 fL (ref 80.0–100.0)
Platelets: 290 10*3/uL (ref 150–400)
RBC: 4.42 MIL/uL (ref 3.87–5.11)
RDW: 13.5 % (ref 11.5–15.5)
WBC: 9.9 10*3/uL (ref 4.0–10.5)
nRBC: 0 % (ref 0.0–0.2)

## 2021-09-10 LAB — I-STAT BETA HCG BLOOD, ED (MC, WL, AP ONLY): I-stat hCG, quantitative: <5 m[IU]/mL (ref <5)

## 2021-09-10 LAB — LIPASE, BLOOD: Lipase: 47 U/L (ref 11–51)

## 2021-09-10 MED ORDER — DICYCLOMINE HCL 20 MG PO TABS: 20.0000 mg | ORAL_TABLET | Freq: Two times a day (BID) | ORAL | 0 refills | Status: DC

## 2021-09-10 MED ORDER — ALUM & MAG HYDROXIDE-SIMETH 200-200-20 MG/5ML PO SUSP
30.0000 mL | Freq: Once | ORAL | Status: AC
  Administered 2021-09-10: 30 mL via ORAL
  Filled 2021-09-10: qty 30

## 2021-09-10 MED ORDER — OMEPRAZOLE 20 MG PO CPDR: 20.0000 mg | DELAYED_RELEASE_CAPSULE | Freq: Every day | ORAL | 0 refills | Status: DC

## 2021-09-10 MED ORDER — ONDANSETRON 4 MG PO TBDP: 4.0000 mg | ORAL_TABLET | Freq: Once | ORAL | Status: DC | PRN

## 2021-09-10 MED ORDER — LIDOCAINE VISCOUS HCL 2 % MT SOLN
15.0000 mL | Freq: Once | OROMUCOSAL | Status: AC
  Administered 2021-09-10: 15 mL via ORAL
  Filled 2021-09-10: qty 15

## 2021-09-10 MED ORDER — FAMOTIDINE 20 MG PO TABS
20.0000 mg | ORAL_TABLET | Freq: Two times a day (BID) | ORAL | 0 refills | Status: DC
Start: 2021-09-10 — End: 2023-09-12

## 2021-09-10 MED ORDER — IOHEXOL 300 MG/ML  SOLN
100.0000 mL | Freq: Once | INTRAMUSCULAR | Status: AC | PRN
  Administered 2021-09-10: 100 mL via INTRAVENOUS

## 2021-09-10 NOTE — ED Notes (Signed)
Pt verbalizes understanding of discharge instructions. Opportunity for questions and answers were provided. Pt discharged from the ED.   ?

## 2021-09-10 NOTE — ED Triage Notes (Signed)
Pt reports on going n/v and abdominal pain x 3 weeks. Pt also reports intermittent bloody stool over the past year ? ?HX: Diverticulitis ?

## 2021-09-10 NOTE — Discharge Instructions (Addendum)
Omeprazole-every day, preventative ?Famotidine-for breakthrough pain (take it when it hurts) ?Dicyclomine-belly pain ?

## 2021-09-10 NOTE — ED Provider Notes (Signed)
?[facility] EMERGENCY DEPARTMENT ?Provider Note ? ? ?CSN: [id number] ?Arrival date & time: 09/10/21  1336 ? ?  ? ?History ? ?Chief Complaint  ?Patient presents with  ? Nausea  ? Emesis  ? ? ?Kendra Becker is a 56 y.o. female. ? ? ?Emesis ?Associated symptoms: abdominal pain and diarrhea   ?Patient presents with 3 weeks of symptoms.  Abdominal pain going into her chest.  Worse with eating.  States she can hardly eat anything now without pain.  Has had nausea.  Has sometimes had this diarrhea or constipation with some stool.  States she is due to get a colonoscopy.  States she has had diverticulitis before.  No fevers.  States anything she eats now will give her the pain.  No fevers or chills.  No weight loss. ?  ? ?Home Medications ?Prior to Admission medications   ?Medication Sig Start Date End Date Taking? Authorizing Provider  ?ALPRAZolam (XANAX) 0.5 MG tablet Take 0.25 mg by mouth 2 (two) times daily as needed. Reported on 09/21/2015    [provider]  ?benzonatate (TESSALON) 200 MG capsule Take 1 capsule (200 mg total) by mouth 3 (three) times daily as needed for cough. 07/16/20   Particia Nearing, [name]  ?cetirizine (ZYRTEC ALLERGY) 10 MG tablet Take 1 tablet (10 mg total) by mouth daily. 07/16/20   Particia Nearing, [name]  ?escitalopram (LEXAPRO) 10 MG tablet Take 1 tablet (10 mg total) by mouth daily. 12/12/19   Wieters, Hallie C, [name]  ?fluticasone (FLONASE) 50 MCG/ACT nasal spray Place 1-2 sprays into both nostrils daily for 7 days. 12/12/19 12/19/19  Wieters, Hallie C, [name]  ?lisinopril (ZESTRIL) 30 MG tablet Take 1 tablet (30 mg total) by mouth every evening. Reported on 09/21/2015 12/12/19   Wieters, Hallie C, [name]  ?ondansetron (ZOFRAN ODT) 4 MG disintegrating tablet Take 1 tablet (4 mg total) by mouth every 8 (eight) hours as needed for nausea or vomiting. 04/23/20   Khatri, Hina, [name]  ?promethazine-dextromethorphan (PROMETHAZINE-DM) 6.25-15 MG/5ML syrup Take 5 mLs by mouth 4  (four) times daily as needed for cough. 12/26/20   Rhys Martini, [name]  ?valACYclovir (VALTREX) 1000 MG tablet Take 1 tablet (1,000 mg total) by mouth 3 (three) times daily. 01/06/18   Eustace Moore, MD  ?   ? ?Allergies    ?Flagyl [metronidazole]   ? ?Review of Systems   ?Review of Systems  ?Constitutional:  Positive for appetite change.  ?HENT:  Negative for congestion.   ?Cardiovascular:  Negative for chest pain.  ?Gastrointestinal:  Positive for abdominal pain, constipation, diarrhea, nausea and vomiting.  ?Musculoskeletal:  Negative for back pain.  ?Skin:  Negative for rash.  ?Neurological:  Negative for weakness.  ?Psychiatric/Behavioral:  Negative for confusion.   ? ?Physical Exam ?Updated Vital Signs ?BP (!) 141/106   Pulse 81   Temp 98.5 ?F (36.9 ?C) (Oral)   Resp 16   LMP 09/15/2011   SpO2 96%  ?Physical Exam ?Vitals and nursing note reviewed.  ?HENT:  ?   Head: Atraumatic.  ?Cardiovascular:  ?   Rate and Rhythm: Regular rhythm.  ?Chest:  ?   Chest wall: No tenderness.  ?Abdominal:  ?   Tenderness: There is abdominal tenderness.  ?   Comments: Mild diffuse tenderness.  Worse in the epigastric area.  Also mild lower abdominal tenderness.  No rebound or guarding.  ?Musculoskeletal:  ?   Cervical back: Neck supple.  ?Skin: ?   General: Skin  is warm.  ?   Capillary Refill: Capillary refill takes less than 2 seconds.  ?Neurological:  ?   Mental Status: She is alert and oriented to person, place, and time.  ? ? ?ED Results / Procedures / Treatments   ?Labs ?(all labs ordered are listed, but only abnormal results are displayed) ?Labs Reviewed  ?COMPREHENSIVE METABOLIC PANEL - Abnormal; Notable for the following components:  ?    Result Value  ? Glucose, Bld 101 (*)   ? AST 14 (*)   ? All other components within normal limits  ?URINALYSIS, ROUTINE W REFLEX MICROSCOPIC - Abnormal; Notable for the following components:  ? Hgb urine dipstick MODERATE (*)   ? All other components within normal limits   ?LIPASE, BLOOD  ?CBC  ?I-STAT BETA HCG BLOOD, ED ([facility], [facility], [facility] ONLY)  ? ? ?EKG ?None ? ?Radiology ?No results found. ? ?Procedures ?Procedures  ? ? ?Medications Ordered in ED ?Medications  ?ondansetron (ZOFRAN-ODT) disintegrating tablet 4 mg (has no administration in time range)  ? ? ?ED Course/ Medical Decision Making/ A&P ?  ?                        ?Medical Decision Making ? ?Patient with abdominal pain.  Goes into the chest.  Worse after eating.  States she has had previous diverticulitis.  Not on a proton pump inhibitor.  Worse laying down.  States more foods are causing the problems now than it did before.  Lab work reassuring.  LFTs not elevated.  Will get CT scan to evaluate with that tenderness in both abdomen and the upper and lower abdomen.  Care turned over to oncoming provider. ? ? ? ? ? ? ? ?Final Clinical Impression(s) / ED Diagnoses ?Final diagnoses:  ?None  ? ? ?Rx / DC Orders ?ED Discharge Orders   ? ? None  ? ?  ? ? ?  ?Benjiman Core, MD ?09/10/21 1555 ? ?

## 2021-09-10 NOTE — ED Provider Notes (Signed)
?  Physical Exam  ?BP (!) 123/106   Pulse 82   Temp 98.5 ?F (36.9 ?C) (Oral)   Resp 18   LMP 09/15/2011   SpO2 97%  ? ?Physical Exam ?Vitals and nursing note reviewed.  ?Constitutional:   ?   General: She is not in acute distress. ?   Appearance: She is well-developed.  ?HENT:  ?   Head: Normocephalic and atraumatic.  ?Eyes:  ?   Conjunctiva/sclera: Conjunctivae normal.  ?Cardiovascular:  ?   Rate and Rhythm: Normal rate and regular rhythm.  ?   Heart sounds: No murmur heard. ?Pulmonary:  ?   Effort: Pulmonary effort is normal. No respiratory distress.  ?   Breath sounds: Normal breath sounds.  ?Abdominal:  ?   Palpations: Abdomen is soft.  ?   Tenderness: There is no abdominal tenderness.  ?Musculoskeletal:     ?   General: No swelling.  ?   Cervical back: Neck supple.  ?Skin: ?   General: Skin is warm and dry.  ?   Capillary Refill: Capillary refill takes less than 2 seconds.  ?Neurological:  ?   Mental Status: She is alert.  ?Psychiatric:     ?   Mood and Affect: Mood normal.  ? ? ?Procedures  ?Procedures ? ?ED Course / MDM  ? ?Clinical Course as of 09/10/21 2313  ?Thu Sep 10, 2021  ?1604 Abdominal pain, likely reflux, pending CT, if neg going home on PPI and pepcid [MK]  ?  ?Clinical Course User Index ?[MK] Tayvia Faughnan, Wyn Forster, MD  ? ?Medical Decision Making ?Risk ?OTC drugs. ?Prescription drug management. ? ? ?Patient received in handoff.  Abdominal pain pending CT.  CT with diverticulosis but otherwise unremarkable.  Patient given a GI cocktail and her symptoms dramatically improved.  Patient discharged with Pepcid, omeprazole and Bentyl. ? ? ? ? ?  ?Glendora Score, MD ?09/10/21 2314 ? ?

## 2021-09-10 NOTE — ED Provider Triage Note (Signed)
Emergency Medicine Provider Triage Evaluation Note ? ?Kendra Becker , a 56 y.o. female  was evaluated in triage.  Pt complains of nausea, vomiting, abdominal pain, intermittent diarrhea/constipation with some hematochezia.  Patient reports history of diverticulitis.  Patient also endorses some previous internal hemorrhoids noted on her colonoscopy.  She denies chest pain, shortness of breath, she reports that she has had a lot of abdominal bloating and it is difficult to keep anything on her stomach.  She reports the pain is 7 out of 10 today.  Symptoms been ongoing for 3 weeks.  No significant change today.. ? ?Review of Systems  ?Positive: Abdominal pain, nausea, vomiting, diarrhea, hematochezia ?Negative: Chest pain, shob, dysuria, hematuria, vaginal bleeding ? ?Physical Exam  ?BP (!) 159/107 (BP Location: Right Arm)   Pulse 83   Temp 98.5 ?F (36.9 ?C) (Oral)   Resp 16   LMP 09/15/2011   SpO2 98%  ?Gen:   Awake, no distress   ?Resp:  Normal effort  ?MSK:   Moves extremities without difficulty  ?Other:  Some TTP in abdomen, especially in suprapubic and LLQ ? ?Medical Decision Making  ?Medically screening exam initiated at 1:46 PM.  Appropriate orders placed.  Kendra Becker was informed that the remainder of the evaluation will be completed by another provider, this initial triage assessment does not replace that evaluation, and the importance of remaining in the ED until their evaluation is complete. ? ?Workup initiated ?  ?Olene Floss, [name] ?09/10/21 1348 ? ?

## 2022-06-05 IMAGING — CT CT ABD-PELV W/ CM
2 of 5 series · 16 of 46 positions shown, 18 images · IV contrast (omnipaque)
Comparison: CT Abdomen and Pelvis 09/21/2015.

CLINICAL DATA: 54-year-old female with nausea, abdominal pain and
bloating for 2 days.

EXAM:
CT ABDOMEN AND PELVIS WITH CONTRAST
TECHNIQUE: Multidetector CT imaging of the abdomen and pelvis was performed
using the standard protocol following bolus administration of
intravenous contrast.
CONTRAST:  100mL OMNIPAQUE IOHEXOL 300 MG/ML  SOLN

[Series 2: a/p w/ 5mm · axial · 0.88mm/px · z∈[+836,+1250]mm · 13 of 93 slices shown, 15 images]
[im 5/93  soft-tissue]
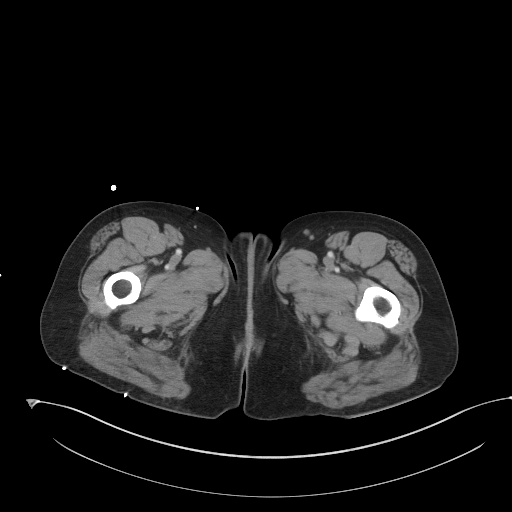
[im 5/93  bone]
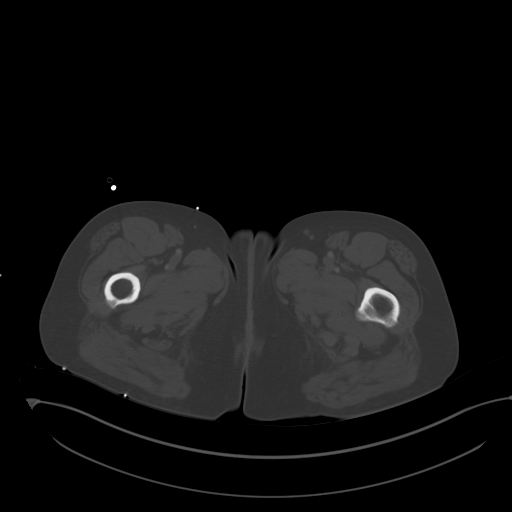
[im 15/93  soft-tissue]
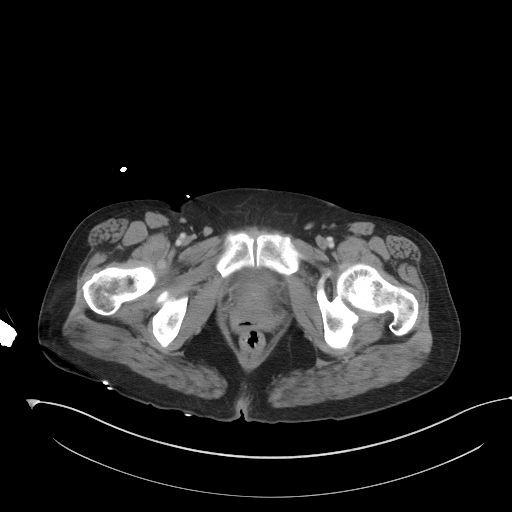
[im 20/93  soft-tissue]
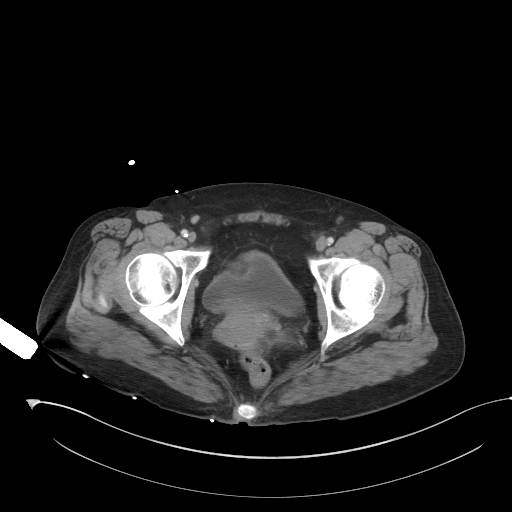
[im 25/93  soft-tissue]
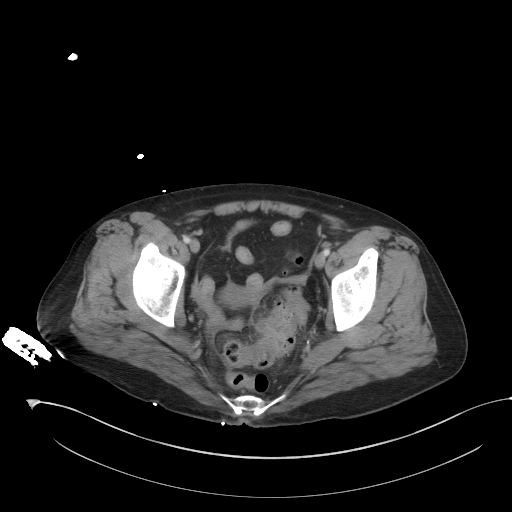
[im 34/93  soft-tissue]
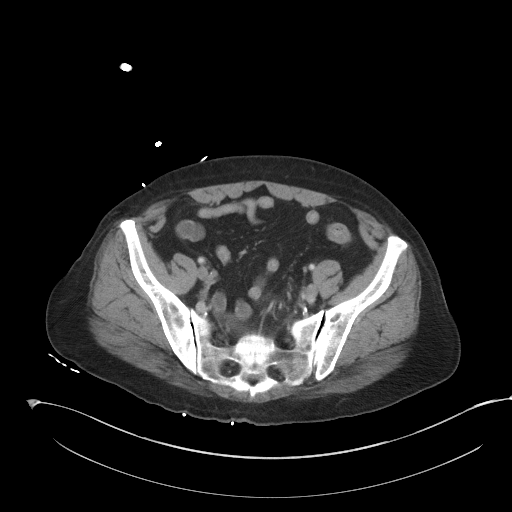
[im 39/93  soft-tissue]
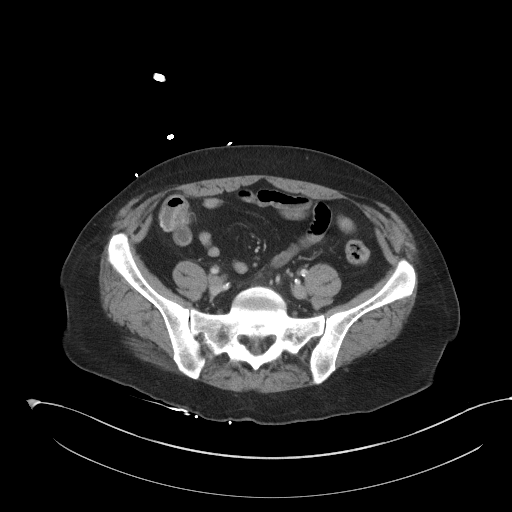
[im 49/93  soft-tissue]
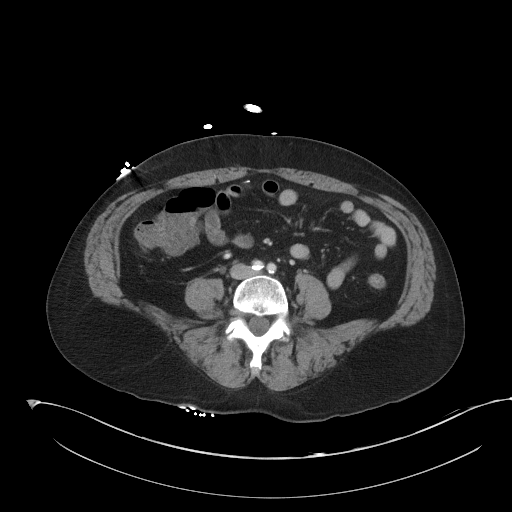
[im 54/93  soft-tissue]
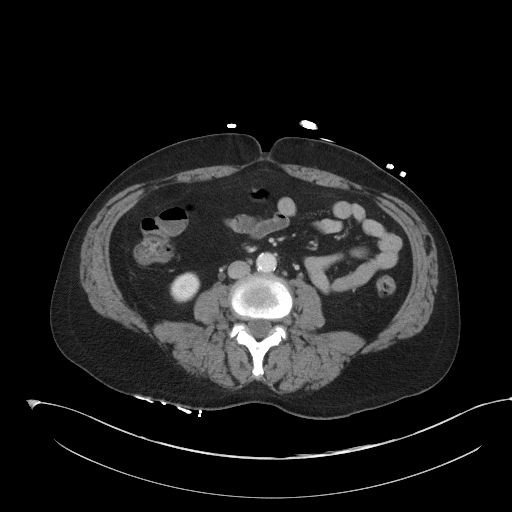
[im 59/93  soft-tissue]
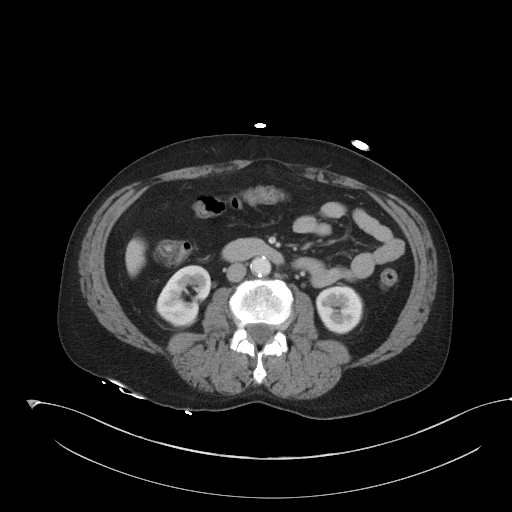
[im 59/93  bone]
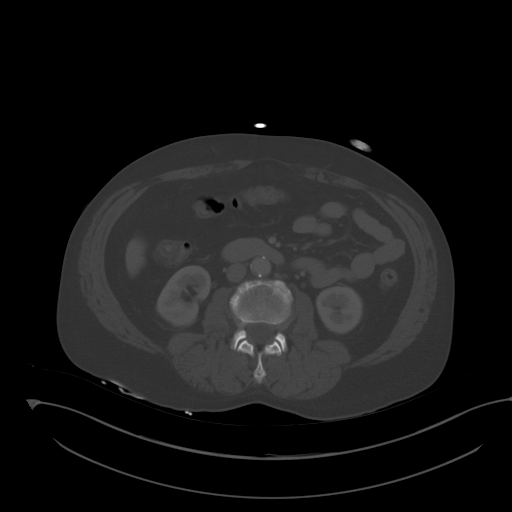
[im 68/93  soft-tissue]
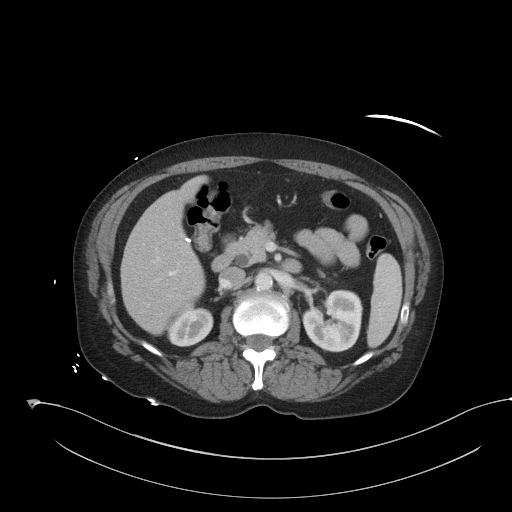
[im 73/93  soft-tissue]
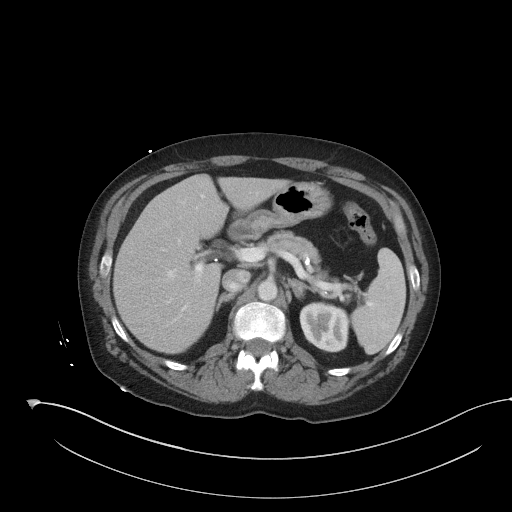
[im 78/93  soft-tissue]
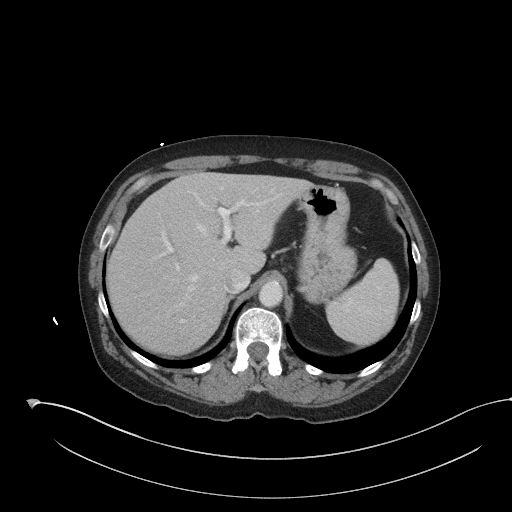
[im 88/93  soft-tissue]
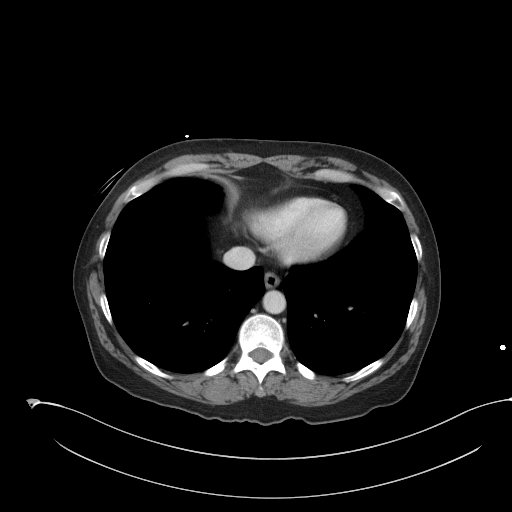

[Series 5: a/p w/ cor · coronal · 0.86mm/px · 3 of 150 slices shown]
[im 50/150  soft-tissue]
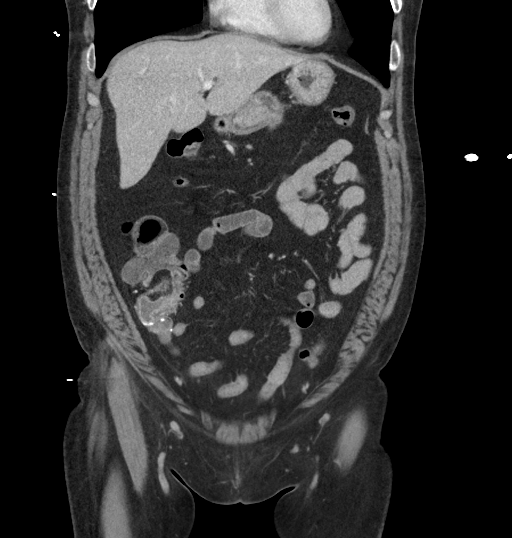
[im 67/150  soft-tissue]
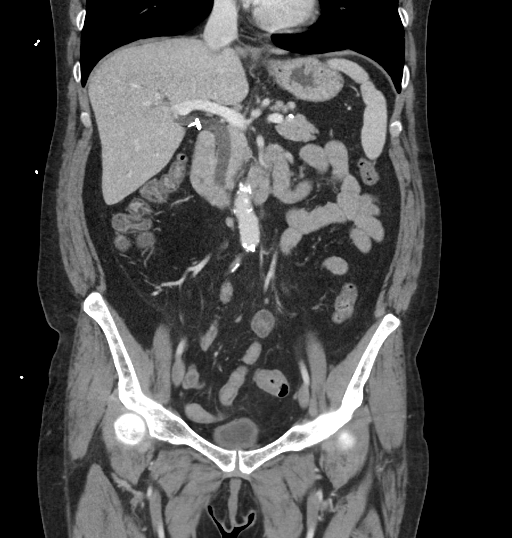
[im 83/150  soft-tissue]
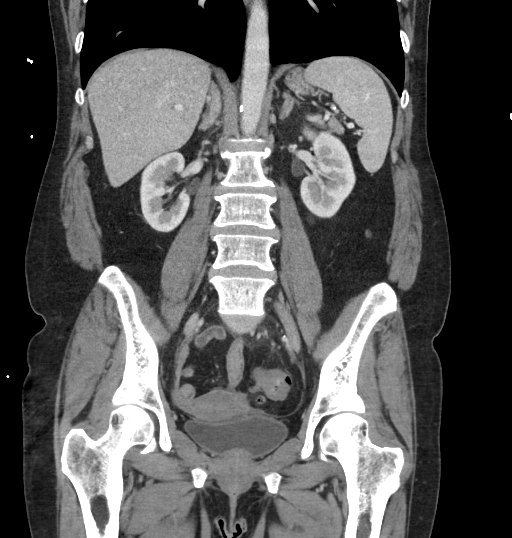

[16 of 46 positions shown; findings below may reference images not displayed]

FINDINGS: Lower chest: Negative.

Hepatobiliary: Remote cholecystectomy. Probable associated small
surgical clip also along the greater omentum is unchanged (series 2,
image 44). Negative liver. Bile ducts are stable.

Pancreas: Negative.

Spleen: Negative.

Adrenals/Urinary Tract: Normal adrenal glands. Bilateral renal
enhancement and contrast excretion is symmetric and within normal
limits. Left ureter courses through the area of pelvic inflammation
but appears to remain normal. No urinary calculus. Urinary bladder
remains within normal limits.

Stomach/Bowel: Negative rectum.

Sigmoid diverticulosis with abnormal circumferential sigmoid wall
thickening and surrounding mesenteric inflammatory stranding. See
coronal image 93, series 2, image 69. A segment of 8 or 9 cm of the
pelvic sigmoid is affected. No extraluminal gas identified. No
organized or drainable fluid collection.

Upstream bowel is nondilated. Descending, transverse, and right
colon appear normal. Evidence of prior appendectomy. Negative
terminal ileum. No dilated small bowel. Negative stomach and
duodenum. No free air, free fluid.

Vascular/Lymphatic: Aortoiliac calcified atherosclerosis. Major
arterial structures in the abdomen and pelvis remain patent. Portal
venous system is patent. No lymphadenopathy.

Reproductive: Mild mass effect on the uterus and adnexa related to
abnormal sigmoid colon and inflammation, but otherwise within normal
limits.

Other: No free fluid in the pelvis.

Musculoskeletal: Stable, negative.
IMPRESSION: 1. Recurrent Sigmoid Diverticulitis in the pelvis. Regional
inflammation with no perforation, abscess, or other complicating
features.

2.  Aortic Atherosclerosis ([id number]-[id number]).

## 2023-09-12 ENCOUNTER — Ambulatory Visit (HOSPITAL_COMMUNITY)
Admission: EM | Admit: 2023-09-12 | Discharge: 2023-09-12 | Disposition: A | Discharge status: Home / Self Care | Attending: Physician Assistant | Admitting: Physician Assistant

## 2023-09-12 ENCOUNTER — Encounter (HOSPITAL_COMMUNITY): Payer: Self-pay

## 2023-09-12 DIAGNOSIS — J069 Acute upper respiratory infection, unspecified: Secondary | ICD-10-CM | POA: Diagnosis not present

## 2023-09-12 DIAGNOSIS — J4521 Mild intermittent asthma with (acute) exacerbation: Secondary | ICD-10-CM | POA: Diagnosis not present

## 2023-09-12 LAB — POC COVID19/FLU A&B COMBO
Covid Antigen, POC: NEGATIVE
Influenza A Antigen, POC: NEGATIVE
Influenza B Antigen, POC: NEGATIVE

## 2023-09-12 MED ORDER — ALBUTEROL SULFATE HFA 108 (90 BASE) MCG/ACT IN AERS: 1.0000 | INHALATION_SPRAY | Freq: Four times a day (QID) | RESPIRATORY_TRACT | 0 refills | Status: AC | PRN

## 2023-09-12 MED ORDER — PROMETHAZINE-DM 6.25-15 MG/5ML PO SYRP: 5.0000 mL | ORAL_SOLUTION | Freq: Three times a day (TID) | ORAL | 0 refills | Status: DC | PRN

## 2023-09-12 MED ORDER — PREDNISONE 20 MG PO TABS: 40.0000 mg | ORAL_TABLET | Freq: Every day | ORAL | 0 refills | Status: AC

## 2023-09-12 NOTE — ED Triage Notes (Signed)
 Patient c/o a non productive cough, chest congestion, and a sore throat x 2 days.  Patient has been taking Mucinex for her symptoms.

## 2023-09-12 NOTE — ED Provider Notes (Signed)
 [facility]    CSN: [id number] Arrival date & time: 09/12/23  1430      History   Chief Complaint Chief Complaint  Patient presents with   Sore Throat   Cough    HPI Kendra Becker is a 58 y.o. female.   Patient presents today with a 2-day history of URI symptoms including cough, congestion, sore throat.  Denies any chest pain, shortness of breath, vomiting.  She reports that her daughter and grandson have been sick with similar symptoms.  She has been taking Mucinex without improvement of symptoms.  She denies any recent antibiotics or steroids.  She has had COVID with last episode over 3 months ago.  She has had influenza vaccination but not had flu this season.  She does have a history of asthma but does not have an albuterol  inhaler available.  Denies hospitalization related to asthma.  She is having difficulty with daily activities as a result of her symptoms.  She is a current everyday smoker.    Past Medical History:  Diagnosis Date   Anal warts    Asthma    History of cerebral aneurysm repair    03-17-2006 clipping anterior communicating artery segment, unruptured   History of diverticulitis of colon    Hypertension    Wears glasses     Patient Active Problem List   Diagnosis Date Noted   Diverticulitis 09/21/2015    Past Surgical History:  Procedure Laterality Date   LAPAROSCOPIC CHOLECYSTECTOMY  01/18/2003   LAPAROSCOPY APPENDECTOMY AND PARTIAL CECECTOMY  2000   LASER ABLATION CONDOLAMATA N/A 05/20/2016   Procedure: EXCISION AND LASER ABLATION OF ANAL CONDYLOMA;  Surgeon: Adalberto Hollow, MD;  Location: [facility];  Service: General;  Laterality: N/A;   RIGHT PTERIONAL CRANIOTOMY CLIPPING ANTERIOR COMMUNICATING ARTERY SEGMENT ANEURYSM MICRODISSECTION  03/17/2006   unruptured    OB History   No obstetric history on file.      Home Medications    Prior to Admission medications   Medication Sig Start Date End Date Taking?  Authorizing Provider  albuterol  (VENTOLIN  HFA) 108 (90 Base) MCG/ACT inhaler Inhale 1-2 puffs into the lungs every 6 (six) hours as needed for wheezing or shortness of breath. 09/12/23  Yes Keyonta Madrid K, [name]  predniSONE  (DELTASONE ) 20 MG tablet Take 2 tablets (40 mg total) by mouth daily for 4 days. 09/12/23 09/16/23 Yes Chace Bisch K, [name]  promethazine -dextromethorphan (PROMETHAZINE -DM) 6.25-15 MG/5ML syrup Take 5 mLs by mouth 3 (three) times daily as needed for cough. 09/12/23  Yes Termaine Roupp K, [name]  ALPRAZolam  (XANAX ) 0.5 MG tablet Take 0.25 mg by mouth 2 (two) times daily as needed. Reported on 09/21/2015    [provider]  cetirizine  (ZYRTEC  ALLERGY) 10 MG tablet Take 1 tablet (10 mg total) by mouth daily. 07/16/20   Corbin Dess, [name]  escitalopram  (LEXAPRO ) 10 MG tablet Take 1 tablet (10 mg total) by mouth daily. 12/12/19   Wieters, Hallie C, [name]  lisinopril  (ZESTRIL ) 30 MG tablet Take 1 tablet (30 mg total) by mouth every evening. Reported on 09/21/2015 12/12/19   Wieters, Hallie C, [name]  omeprazole  (PRILOSEC) 20 MG capsule Take 1 capsule (20 mg total) by mouth daily. 09/10/21 10/10/21  Kommor, Alyse July, MD    Family History Family History  Problem Relation Age of Onset   Hypertension Mother    Aneurysm Father     Social History Social History   Tobacco Use   Smoking status: Every Day  Current packs/day: 1.50    Average packs/day: 1.5 packs/day for 28.0 years (42.0 ttl pk-yrs)    Types: Cigarettes   Smokeless tobacco: Never  Vaping Use   Vaping status: Never Used  Substance Use Topics   Alcohol use: Yes    Comment: occasional   Drug use: No     Allergies   Flagyl  [metronidazole ]   Review of Systems Review of Systems  Constitutional:  Positive for activity change and fatigue. Negative for appetite change and fever.  HENT:  Positive for congestion, postnasal drip and sore throat. Negative for sinus pressure and sneezing.   Respiratory:  Positive for  cough. Negative for shortness of breath.   Cardiovascular:  Negative for chest pain.  Gastrointestinal:  Negative for abdominal pain, diarrhea, nausea and vomiting.  Neurological:  Negative for dizziness, light-headedness and headaches.     Physical Exam Triage Vital Signs ED Triage Vitals [09/12/23 1543]  Encounter Vitals Group     BP 137/82     Systolic BP Percentile      Diastolic BP Percentile      Pulse Rate 99     Resp 16     Temp 98.4 F (36.9 C)     Temp Source Oral     SpO2 93 %     Weight      Height      Head Circumference      Peak Flow      Pain Score 9     Pain Loc      Pain Education      Exclude from Growth Chart    No data found.  Updated Vital Signs BP 137/82 (BP Location: Right Arm)   Pulse 99   Temp 98.4 F (36.9 C) (Oral)   Resp 16   LMP 09/15/2011   SpO2 93%   Visual Acuity Right Eye Distance:   Left Eye Distance:   Bilateral Distance:    Right Eye Near:   Left Eye Near:    Bilateral Near:     Physical Exam Vitals reviewed.  Constitutional:      General: She is awake. She is not in acute distress.    Appearance: Normal appearance. She is well-developed. She is not ill-appearing.     Comments: Very pleasant female appears stated age in no acute distress sitting comfortably in exam room  HENT:     Head: Normocephalic and atraumatic.     Right Ear: Ear canal and external ear normal. There is impacted cerumen.     Left Ear: Ear canal and external ear normal. There is impacted cerumen.     Ears:     Comments: Cerumen impaction noted bilaterally; able to visualize approximately 20% of TM that appears normal.    Nose:     Right Sinus: No maxillary sinus tenderness or frontal sinus tenderness.     Left Sinus: No maxillary sinus tenderness or frontal sinus tenderness.     Mouth/Throat:     Pharynx: Uvula midline. Posterior oropharyngeal erythema and postnasal drip present. No oropharyngeal exudate.  Cardiovascular:     Rate and Rhythm:  Normal rate and regular rhythm.     Heart sounds: Normal heart sounds, S1 normal and S2 normal. No murmur heard. Pulmonary:     Effort: Pulmonary effort is normal.     Breath sounds: Normal breath sounds. No wheezing, rhonchi or rales.     Comments: Clear auscultation bilaterally Psychiatric:        Behavior: Behavior is  cooperative.      UC Treatments / Results  Labs (all labs ordered are listed, but only abnormal results are displayed) Labs Reviewed  POC COVID19/FLU A&B COMBO    EKG   Radiology No results found.  Procedures Procedures (including critical care time)  Medications Ordered in UC Medications - No data to display  Initial Impression / Assessment and Plan / UC Course  I have reviewed the triage vital signs and the nursing notes.  Pertinent labs & imaging results that were available during my care of the patient were reviewed by me and considered in my medical decision making (see chart for details).     Patient is well-appearing, afebrile, nontoxic, nontachycardic.  No evidence of acute infection on physical exam that warrant initiation of antibiotics.  COVID and flu testing were negative.  We discussed that she likely has a different virus that has triggered her asthma.  She was given a prescription for albuterol  injection to use this every 4-6 hours as needed.  Will also start prednisone  burst we discussed that she should not take NSAIDs with this medication due to risk of GI bleeding.  She can use over-the-counter medicine such as Mucinex, Flonase , Tylenol , nasal saline/sinus rinses.  She was given Promethazine  DM for cough.  We discussed that this can be sedating and she is not to drive or drink alcohol with taking it.  If her symptoms are not improving within a week or if anything worsens and she has high fever, worsening cough, shortness of breath, chest pain she needs to be seen immediately.  Strict return precautions given.  Excuse note provided.  Final  Clinical Impressions(s) / UC Diagnoses   Final diagnoses:  Upper respiratory tract infection, unspecified type  Mild intermittent asthma with acute exacerbation     Discharge Instructions      You are negative for flu and COVID.  I suspect that you have a viral illness that has triggered your asthma.  Use albuterol  every 4-6 hours as needed.  Start prednisone  40 mg for 4 days.  Do not take NSAIDs with this medication including aspirin, ibuprofen/Advil, naproxen/Aleve.  Use Promethazine  DM for cough.  This will make you sleepy so do not drive or drink alcohol while taking it.  Use Mucinex, Flonase , nasal saline/sinus rinses for additional symptom relief.  If you are not feeling better within a week please return for reevaluation.  If anything worsens and you have high fever, worsening cough, shortness of breath, chest pain, weakness you need to be seen immediately.     ED Prescriptions     Medication Sig Dispense Auth. Provider   albuterol  (VENTOLIN  HFA) 108 (90 Base) MCG/ACT inhaler Inhale 1-2 puffs into the lungs every 6 (six) hours as needed for wheezing or shortness of breath. 18 g Chantrice Hagg K, [name]   predniSONE  (DELTASONE ) 20 MG tablet Take 2 tablets (40 mg total) by mouth daily for 4 days. 8 tablet Dmarco Baldus K, [name]   promethazine -dextromethorphan (PROMETHAZINE -DM) 6.25-15 MG/5ML syrup Take 5 mLs by mouth 3 (three) times daily as needed for cough. 118 mL Marrie Chandra K, [name]      PDMP not reviewed this encounter.   Budd Cargo, [name] 09/12/23 1722

## 2023-09-12 NOTE — Discharge Instructions (Signed)
 You are negative for flu and COVID.  I suspect that you have a viral illness that has triggered your asthma.  Use albuterol  every 4-6 hours as needed.  Start prednisone  40 mg for 4 days.  Do not take NSAIDs with this medication including aspirin, ibuprofen/Advil, naproxen/Aleve.  Use Promethazine  DM for cough.  This will make you sleepy so do not drive or drink alcohol while taking it.  Use Mucinex, Flonase , nasal saline/sinus rinses for additional symptom relief.  If you are not feeling better within a week please return for reevaluation.  If anything worsens and you have high fever, worsening cough, shortness of breath, chest pain, weakness you need to be seen immediately.

## 2023-10-23 IMAGING — CT CT ABD-PELV W/ CM
2 of 5 series · 16 of 46 positions shown, 18 images · IV contrast (Omni 300)
Comparison: April 23, 2020

CLINICAL DATA: Acute non localized lower abdominal pain associated
with nausea, vomiting, intermittent diarrhea/constipation with some
hematochezia. Patient has history of diverticulitis and internal
hemorrhoids.

EXAM:
CT ABDOMEN AND PELVIS WITH CONTRAST
TECHNIQUE: Multidetector CT imaging of the abdomen and pelvis was performed
using the standard protocol following bolus administration of
intravenous contrast.

[Series 3: a/p w/ 5mm · axial · 0.90mm/px · z∈[+800,+1204]mm · 13 of 93 slices shown, 15 images]
[im 6/93  soft-tissue]
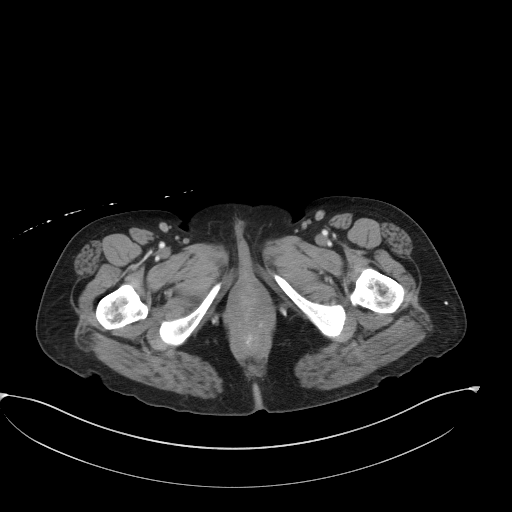
[im 6/93  bone]
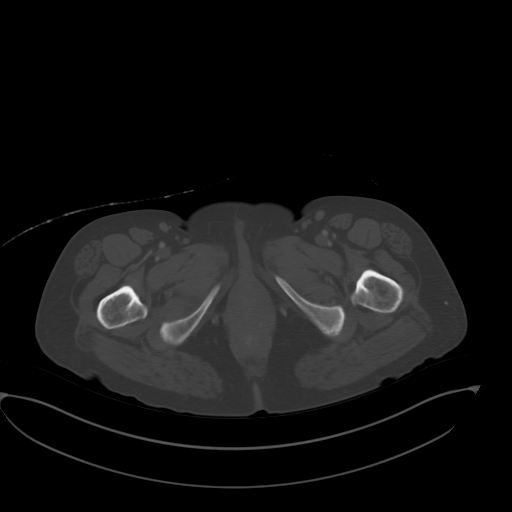
[im 11/93  soft-tissue]
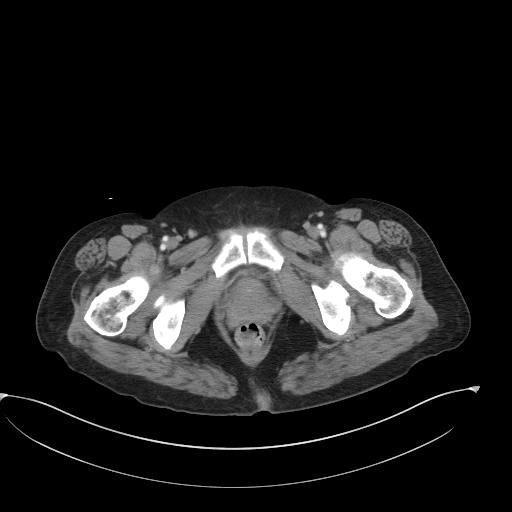
[im 21/93  soft-tissue]
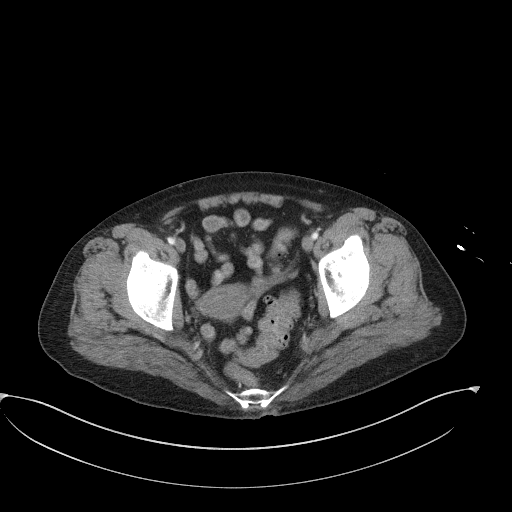
[im 26/93  soft-tissue]
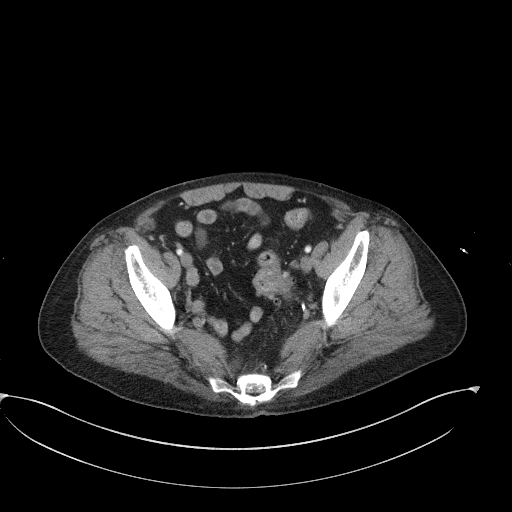
[im 31/93  soft-tissue]
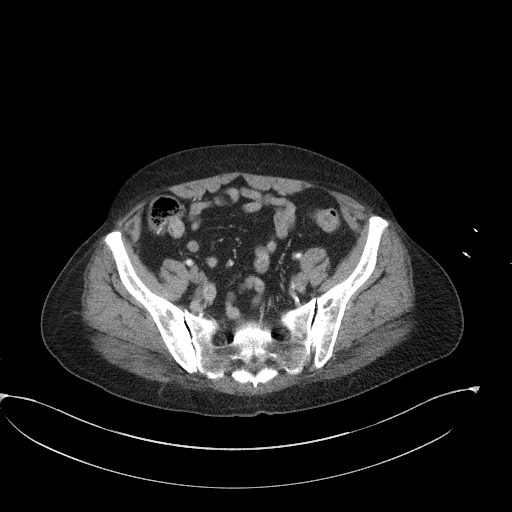
[im 41/93  soft-tissue]
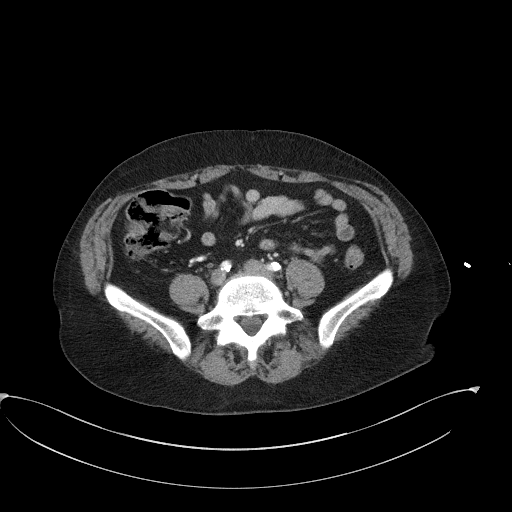
[im 47/93  soft-tissue]
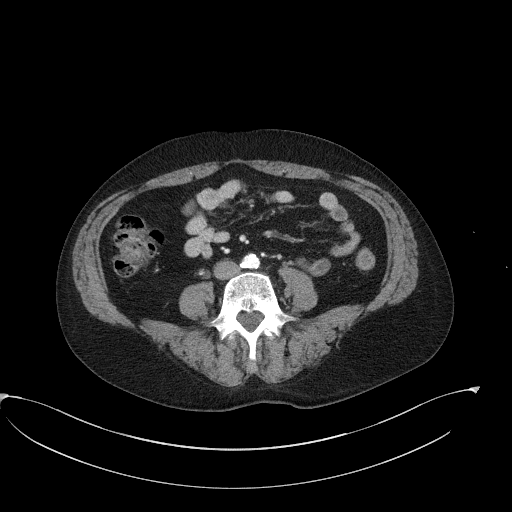
[im 52/93  soft-tissue]
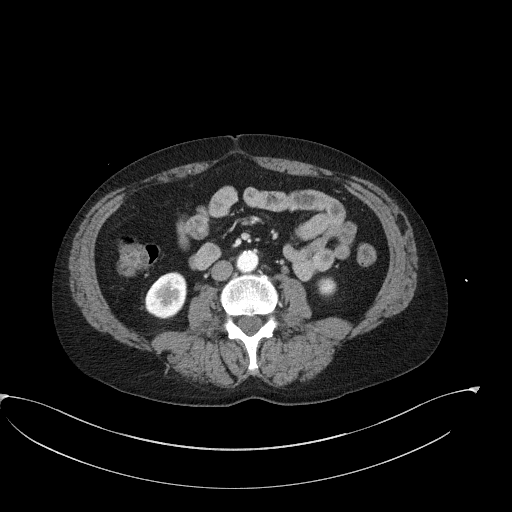
[im 62/93  soft-tissue]
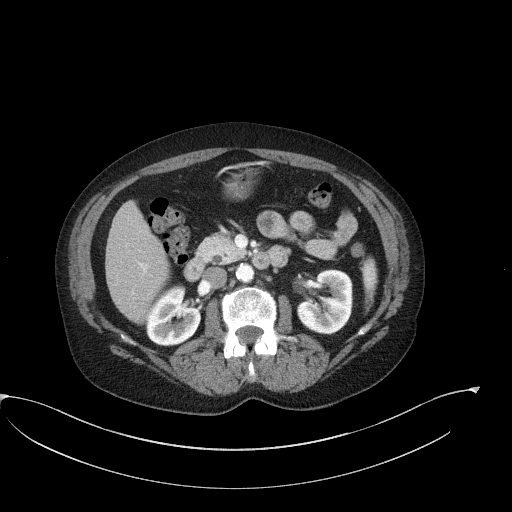
[im 62/93  bone]
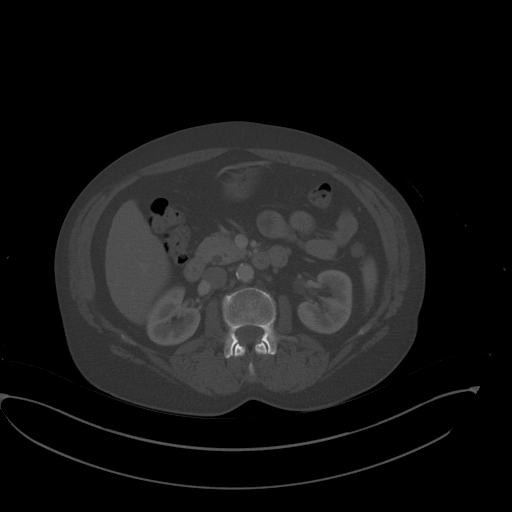
[im 67/93  soft-tissue]
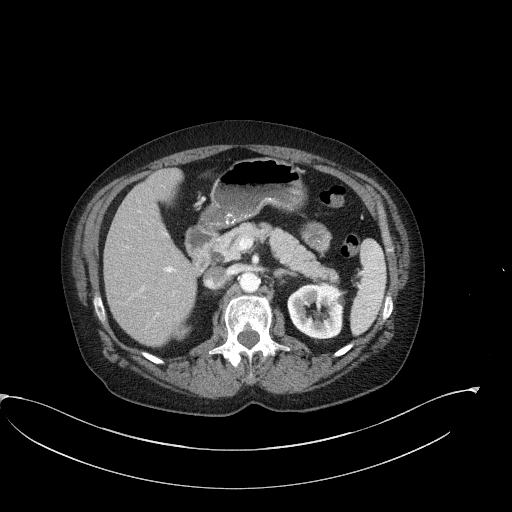
[im 72/93  soft-tissue]
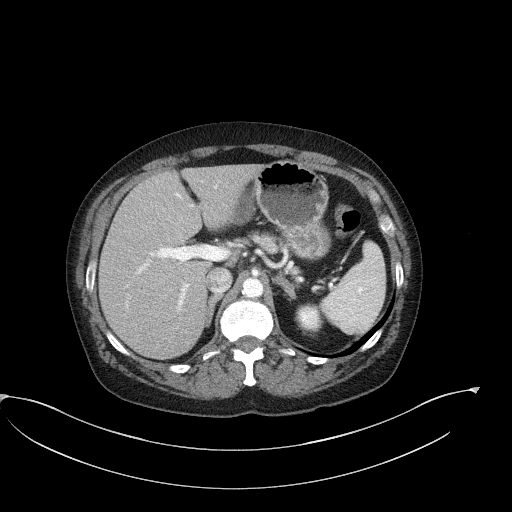
[im 82/93  soft-tissue]
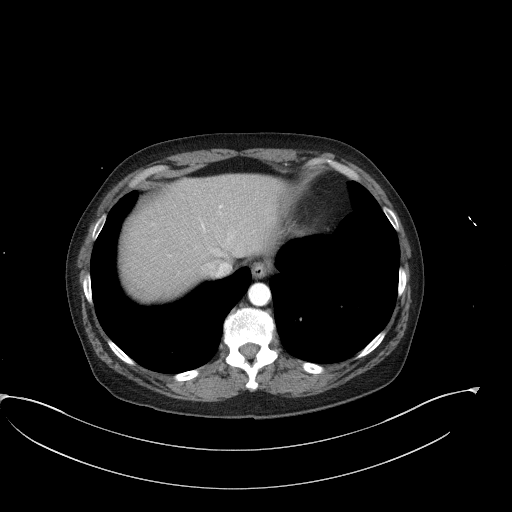
[im 87/93  soft-tissue]
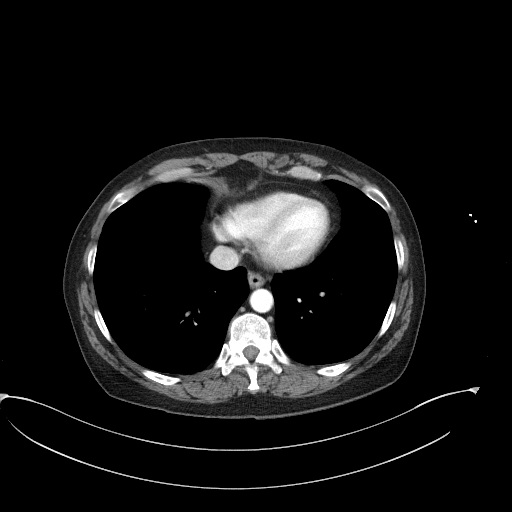

[Series 6: a/p w/ cor · coronal · 0.78mm/px · 3 of 140 slices shown]
[im 47/140  soft-tissue]
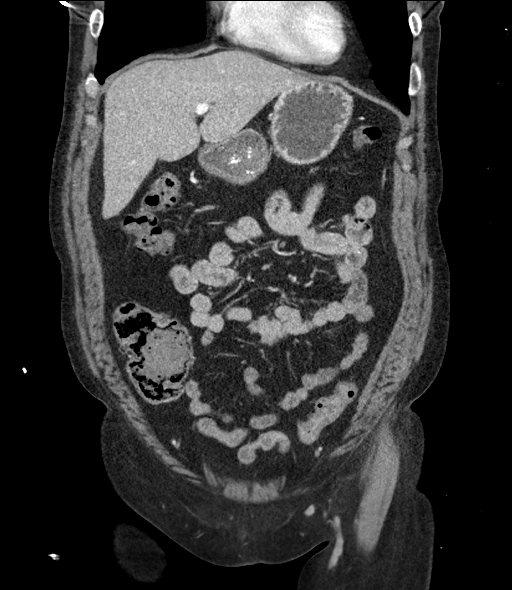
[im 62/140  soft-tissue]
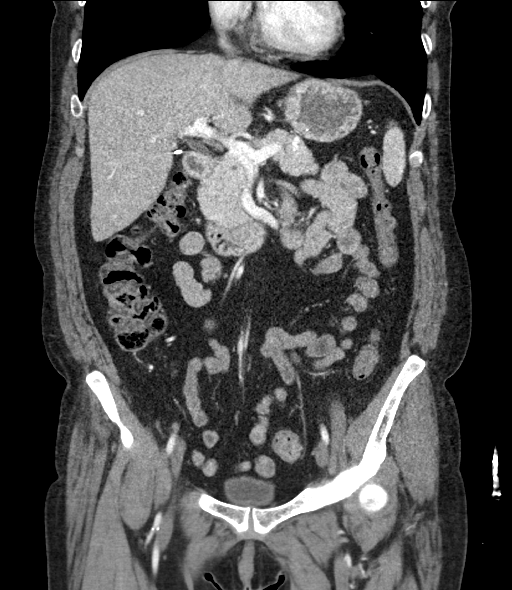
[im 78/140  soft-tissue]
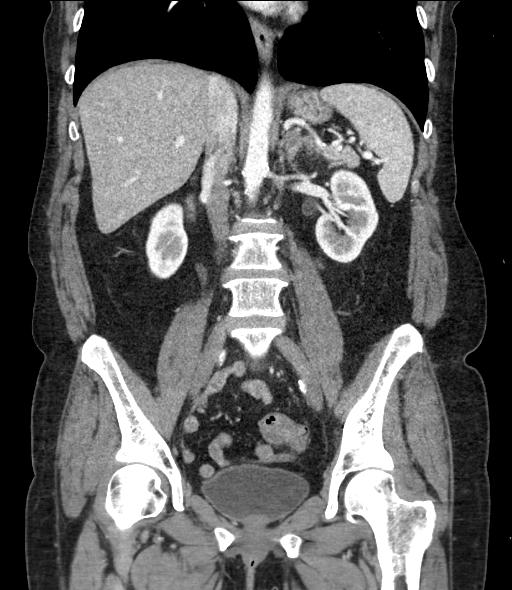

[16 of 46 positions shown; findings below may reference images not displayed]

RADIATION DOSE REDUCTION: This exam was performed according to the
departmental dose-optimization program which includes automated
exposure control, adjustment of the mA and/or kV according to
patient size and/or use of iterative reconstruction technique.

CONTRAST:  100mL OMNIPAQUE IOHEXOL 300 MG/ML  SOLN
FINDINGS: Lower chest: Lung bases are clear.

Hepatobiliary: Status post cholecystectomy. No intrahepatic biliary
dilatation.

Pancreas: Unremarkable

Spleen: Has a normal appearance

Adrenals/Urinary Tract: Adrenals and kidneys have a normal
appearance except for a small cyst in the upper pole of the left
kidney and minimal diffuse enlargement of the left adrenal, stable.

Stomach/Bowel: Bowel-gas pattern is nonobstructive. Status post
appendectomy. Moderate diverticulosis of the sigmoid colon without
evidence of diverticulitis. Previously seen diverticulitis has
resolved.

Vascular/Lymphatic: Normal

Reproductive: Unremarkable

Other: No free air, fluid, inflammatory changes, mass or significant
lymphadenopathy

Musculoskeletal: Unremarkable
IMPRESSION: Moderate diverticulosis of the sigmoid colon without diverticulitis.
No free air, fluid, inflammatory changes, mass or significant
lymphadenopathy.

## 2023-11-22 ENCOUNTER — Other Ambulatory Visit: Payer: Self-pay

## 2023-11-22 ENCOUNTER — Encounter (HOSPITAL_COMMUNITY): Payer: Self-pay | Admitting: Emergency Medicine

## 2023-11-22 ENCOUNTER — Emergency Department (HOSPITAL_COMMUNITY)
Admission: EM | Admit: 2023-11-22 | Discharge: 2023-11-23 | Disposition: A | Discharge status: Home / Self Care | Attending: Emergency Medicine | Admitting: Emergency Medicine

## 2023-11-22 DIAGNOSIS — K5732 Diverticulitis of large intestine without perforation or abscess without bleeding: Secondary | ICD-10-CM | POA: Insufficient documentation

## 2023-11-22 DIAGNOSIS — R1030 Lower abdominal pain, unspecified: Secondary | ICD-10-CM | POA: Diagnosis present

## 2023-11-22 DIAGNOSIS — K5792 Diverticulitis of intestine, part unspecified, without perforation or abscess without bleeding: Secondary | ICD-10-CM

## 2023-11-22 LAB — URINALYSIS, ROUTINE W REFLEX MICROSCOPIC
Bilirubin Urine: NEGATIVE
Glucose, UA: NEGATIVE mg/dL
Ketones, ur: NEGATIVE mg/dL
Nitrite: NEGATIVE
Protein, ur: NEGATIVE mg/dL
Specific Gravity, Urine: 1.002 — ABNORMAL LOW (ref 1.005–1.030)
pH: 5 (ref 5.0–8.0)

## 2023-11-22 LAB — CBC WITH DIFFERENTIAL/PLATELET
Abs Immature Granulocytes: 0.03 K/uL (ref 0.00–0.07)
Basophils Absolute: 0.1 K/uL (ref 0.0–0.1)
Basophils Relative: 1 %
Eosinophils Absolute: 0.2 K/uL (ref 0.0–0.5)
Eosinophils Relative: 3 %
HCT: 37.7 % (ref 36.0–46.0)
Hemoglobin: 12.7 g/dL (ref 12.0–15.0)
Immature Granulocytes: 0 %
Lymphocytes Relative: 34 %
Lymphs Abs: 2.8 K/uL (ref 0.7–4.0)
MCH: 30.2 pg (ref 26.0–34.0)
MCHC: 33.7 g/dL (ref 30.0–36.0)
MCV: 89.5 fL (ref 80.0–100.0)
Monocytes Absolute: 0.5 K/uL (ref 0.1–1.0)
Monocytes Relative: 7 %
Neutro Abs: 4.4 K/uL (ref 1.7–7.7)
Neutrophils Relative %: 55 %
Platelets: 351 K/uL (ref 150–400)
RBC: 4.21 MIL/uL (ref 3.87–5.11)
RDW: 13.2 % (ref 11.5–15.5)
WBC: 8 K/uL (ref 4.0–10.5)
nRBC: 0 % (ref 0.0–0.2)

## 2023-11-22 LAB — LIPASE, BLOOD: Lipase: 22 U/L (ref 11–51)

## 2023-11-22 LAB — COMPREHENSIVE METABOLIC PANEL WITH GFR
ALT: 10 U/L (ref 0–44)
AST: 18 U/L (ref 15–41)
Albumin: 3.7 g/dL (ref 3.5–5.0)
Alkaline Phosphatase: 83 U/L (ref 38–126)
Anion gap: 11 (ref 5–15)
BUN: 6 mg/dL (ref 6–20)
CO2: 25 mmol/L (ref 22–32)
Calcium: 9.2 mg/dL (ref 8.9–10.3)
Chloride: 97 mmol/L — ABNORMAL LOW (ref 98–111)
Creatinine, Ser: 1.16 mg/dL — ABNORMAL HIGH (ref 0.44–1.00)
GFR, Estimated: 55 mL/min — ABNORMAL LOW (ref >60)
Glucose, Bld: 110 mg/dL — ABNORMAL HIGH (ref 70–99)
Potassium: 3.8 mmol/L (ref 3.5–5.1)
Sodium: 133 mmol/L — ABNORMAL LOW (ref 135–145)
Total Bilirubin: 0.6 mg/dL (ref 0.0–1.2)
Total Protein: 7.2 g/dL (ref 6.5–8.1)

## 2023-11-22 MED ORDER — ONDANSETRON 4 MG PO TBDP: 4.0000 mg | ORAL_TABLET | Freq: Once | ORAL | Status: DC

## 2023-11-22 MED ORDER — OXYCODONE-ACETAMINOPHEN 5-325 MG PO TABS: 1.0000 | ORAL_TABLET | Freq: Once | ORAL | Status: DC

## 2023-11-22 NOTE — ED Provider Triage Note (Signed)
 Emergency Medicine Provider Triage Evaluation Note  JANNAE FAGERSTROM , a 58 y.o. female  was evaluated in triage.  Pt complains of abdominal pain. Symptoms x 5 days. Pain in lower abdomen. Feels worse than previous diverticulitis.  Denies nausea, vomiting, or diarrhea.  Review of Systems  Positive:  Negative:   Physical Exam  BP 127/83   Pulse 91   Temp 97.8 F (36.6 C)   Resp 18   Ht 5' 9 (1.753 m)   Wt 66.7 kg   LMP 09/15/2011   SpO2 97%   BMI 21.71 kg/m  Gen:   Awake, no distress   Resp:  Normal effort  MSK:   Moves extremities without difficulty  Other:  TTP throughout the lower abdomen  Medical Decision Making  Medically screening exam initiated at 6:59 PM.  Appropriate orders placed.  Jamilah L Roach was informed that the remainder of the evaluation will be completed by another provider, this initial triage assessment does not replace that evaluation, and the importance of remaining in the ED until their evaluation is complete.     Nora Lauraine DELENA DEVONNA 11/22/23 1902

## 2023-11-22 NOTE — ED Triage Notes (Signed)
 Patient c/o abdominal pain, bloating, nausea, since Friday morning. Patient has not tried to eat anything, has hx of multiple GI problems.

## 2023-11-22 NOTE — ED Triage Notes (Signed)
 Patient reports not eating anything since Friday morning. On Thursday attempted to eat some broth and felt that worsened symptoms. C/o severe rectal pressure.  States she has hemorrhoids.

## 2023-11-23 ENCOUNTER — Emergency Department (HOSPITAL_COMMUNITY)

## 2023-11-23 MED ORDER — LACTATED RINGERS IV BOLUS
1000.0000 mL | Freq: Once | INTRAVENOUS | Status: AC
  Administered 2023-11-23: 1000 mL via INTRAVENOUS

## 2023-11-23 MED ORDER — ONDANSETRON HCL 4 MG/2ML IJ SOLN
4.0000 mg | Freq: Once | INTRAMUSCULAR | Status: AC
  Administered 2023-11-23: 4 mg via INTRAVENOUS
  Filled 2023-11-23: qty 2

## 2023-11-23 MED ORDER — SODIUM CHLORIDE 0.9 % IV SOLN
3.0000 g | Freq: Once | INTRAVENOUS | Status: AC
  Administered 2023-11-23: 3 g via INTRAVENOUS
  Filled 2023-11-23: qty 8

## 2023-11-23 MED ORDER — IOHEXOL 350 MG/ML SOLN
75.0000 mL | Freq: Once | INTRAVENOUS | Status: AC | PRN
  Administered 2023-11-23: 75 mL via INTRAVENOUS

## 2023-11-23 MED ORDER — DOCUSATE SODIUM 100 MG PO CAPS: 100.0000 mg | ORAL_CAPSULE | Freq: Two times a day (BID) | ORAL | 0 refills | Status: DC

## 2023-11-23 MED ORDER — OXYCODONE-ACETAMINOPHEN 5-325 MG PO TABS
1.0000 | ORAL_TABLET | Freq: Once | ORAL | Status: AC
  Administered 2023-11-23: 1 via ORAL
  Filled 2023-11-23: qty 1

## 2023-11-23 MED ORDER — OXYCODONE-ACETAMINOPHEN 5-325 MG PO TABS: 1.0000 | ORAL_TABLET | Freq: Three times a day (TID) | ORAL | 0 refills | Status: DC | PRN

## 2023-11-23 MED ORDER — AMOXICILLIN-POT CLAVULANATE 875-125 MG PO TABS: 1.0000 | ORAL_TABLET | Freq: Two times a day (BID) | ORAL | 0 refills | Status: AC

## 2023-11-23 MED ORDER — HYDROMORPHONE HCL 1 MG/ML IJ SOLN
1.0000 mg | Freq: Once | INTRAMUSCULAR | Status: AC
  Administered 2023-11-23: 1 mg via INTRAVENOUS
  Filled 2023-11-23: qty 1

## 2023-11-23 NOTE — ED Notes (Signed)
 Assumed care of patient at this time

## 2023-11-23 NOTE — ED Provider Notes (Signed)
 [place] EMERGENCY DEPARTMENT AT [facility] Provider Note   CSN: [id number] Arrival date & time: 11/22/23  8191     Patient presents with: Abdominal Pain   Kendra Becker is a 58 y.o. female.   58 year old female who presents ER today with lower abdominal pain.  She states been going on for about a week.  She states she has had a ruptured appendicitis in the past and it feels similar to that.  She also has a history of diverticulitis but does not know if it similar to that or not.  She states no fevers but she has had nausea and constipation as well.  She has had surgeries on her abdomen before.  No history of bowel obstructions.  No urinary symptoms.  No other associated symptoms.   Abdominal Pain      Prior to Admission medications   Medication Sig Start Date End Date Taking? Authorizing Provider  amoxicillin -clavulanate (AUGMENTIN ) 875-125 MG tablet Take 1 tablet by mouth every 12 (twelve) hours for 10 days. 11/23/23 12/03/23 Yes Keeli Roberg, Selinda, MD  docusate sodium  (COLACE) 100 MG capsule Take 1 capsule (100 mg total) by mouth every 12 (twelve) hours. 11/23/23  Yes Mattew Chriswell, Selinda, MD  oxyCODONE -acetaminophen  (PERCOCET) 5-325 MG tablet Take 1 tablet by mouth every 8 (eight) hours as needed for severe pain (pain score 7-10). 11/23/23  Yes Kwynn Schlotter, Selinda, MD  albuterol  (VENTOLIN  HFA) 108 (90 Base) MCG/ACT inhaler Inhale 1-2 puffs into the lungs every 6 (six) hours as needed for wheezing or shortness of breath. 09/12/23   Raspet, Erin K, [name]  ALPRAZolam  (XANAX ) 0.5 MG tablet Take 0.25 mg by mouth 2 (two) times daily as needed. Reported on 09/21/2015    [provider]  cetirizine  (ZYRTEC  ALLERGY) 10 MG tablet Take 1 tablet (10 mg total) by mouth daily. 07/16/20   Stuart Vernell Norris, [name]  escitalopram  (LEXAPRO ) 10 MG tablet Take 1 tablet (10 mg total) by mouth daily. 12/12/19   Wieters, Hallie C, [name]  lisinopril  (ZESTRIL ) 30 MG tablet Take 1 tablet (30 mg total) by mouth  every evening. Reported on 09/21/2015 12/12/19   Wieters, Hallie C, [name]  omeprazole  (PRILOSEC) 20 MG capsule Take 1 capsule (20 mg total) by mouth daily. 09/10/21 10/10/21  Kommor, Madison, MD  promethazine -dextromethorphan (PROMETHAZINE -DM) 6.25-15 MG/5ML syrup Take 5 mLs by mouth 3 (three) times daily as needed for cough. 09/12/23   Raspet, Rocky POUR, [name]    Allergies: Flagyl  [metronidazole ]    Review of Systems  Gastrointestinal:  Positive for abdominal pain.    Updated Vital Signs BP 99/66 (BP Location: Right Arm)   Pulse 89   Temp 97.8 F (36.6 C) (Oral)   Resp 16   Ht 5' 9 (1.753 m)   Wt 66.7 kg   LMP 09/15/2011   SpO2 97%   BMI 21.71 kg/m   Physical Exam Vitals and nursing note reviewed.  Constitutional:      Appearance: She is well-developed.  HENT:     Head: Normocephalic and atraumatic.  Cardiovascular:     Rate and Rhythm: Normal rate and regular rhythm.  Pulmonary:     Effort: No respiratory distress.     Breath sounds: No stridor.  Abdominal:     General: There is no distension.     Tenderness: There is abdominal tenderness (Worse on the left than the right, no rebound or guarding.) in the right lower quadrant and left lower quadrant.  Musculoskeletal:     Cervical back:  Normal range of motion.  Neurological:     Mental Status: She is alert.     (all labs ordered are listed, but only abnormal results are displayed) Labs Reviewed  COMPREHENSIVE METABOLIC PANEL WITH GFR - Abnormal; Notable for the following components:      Result Value   Sodium 133 (*)    Chloride 97 (*)    Glucose, Bld 110 (*)    Creatinine, Ser 1.16 (*)    GFR, Estimated 55 (*)    All other components within normal limits  URINALYSIS, ROUTINE W REFLEX MICROSCOPIC - Abnormal; Notable for the following components:   APPearance HAZY (*)    Specific Gravity, Urine 1.002 (*)    Hgb urine dipstick SMALL (*)    Leukocytes,Ua LARGE (*)    Bacteria, UA RARE (*)    All other components  within normal limits  LIPASE, BLOOD  CBC WITH DIFFERENTIAL/PLATELET    EKG: None  Radiology: CT ABDOMEN PELVIS W CONTRAST Result Date: 11/23/2023 EXAM: CT ABDOMEN AND PELVIS WITH CONTRAST 11/23/2023 04:04:35 AM TECHNIQUE: CT of the abdomen and pelvis was performed with the administration of intravenous contrast (75mL iohexol  (OMNIPAQUE ) 350 MG/ML injection). Multiplanar reformatted images are provided for review. Automated exposure control, iterative reconstruction, and/or weight based adjustment of the mA/kV was utilized to reduce the radiation dose to as low as reasonably achievable. COMPARISON: CT abdomen and pelvis 12/10/2021 and 04/23/2020. CLINICAL HISTORY: Abdominal pain, acute, nonlocalized. Patient c/o abdominal pain, bloating, nausea, since Friday morning. Patient has not tried to eat anything, has hx of multiple GI problems. FINDINGS: LOWER CHEST: No acute abnormality. LIVER: The liver is unremarkable. GALLBLADDER AND BILE DUCTS: Cholecystectomy noted. Mild intra and extrahepatic biliary dilation has increased. No obstructing lesion is present. SPLEEN: No acute abnormality. PANCREAS: No acute abnormality. ADRENAL GLANDS: No acute abnormality. KIDNEYS, URETERS AND BLADDER: An 8 mm simple cyst is present at the upper pole of the left kidney. Recommend no imaging follow up. No stones in the kidneys or ureters. No hydronephrosis. No perinephric or periureteral stranding. Urinary bladder is unremarkable. GI AND BOWEL: Appendectomy noted. Inflammatory changes are present about the sigmoid colon consistent with acute diverticulitis. Minimal free fluid is associated. No abscess formation or free air is present. PERITONEUM AND RETROPERITONEUM: No ascites. No free air. VASCULATURE: Atherosclerotic calcifications are present in the aorta and brown vessels without aneurysm. 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 LYMPH NODES: No lymphadenopathy. REPRODUCTIVE ORGANS: No acute abnormality.  BONES AND SOFT TISSUES: No acute osseous abnormality. No focal soft tissue abnormality. IMPRESSION: 1. Acute diverticulitis of the sigmoid colon with minimal associated free fluid. No abscess or free air. 2. Mild progressive intra and extrahepatic biliary dilation without obstructing lesion. Cholecystectomy noted. 3. Atherosclerotic calcifications in the aorta and branch vessels without aneurysm. Proximal iliac artery stenosis bilaterally. Electronically signed by: Lonni Necessary MD 11/23/2023 04:21 AM EDT RP Workstation: [id number]     Procedures   Medications Ordered in the ED  HYDROmorphone  (DILAUDID ) injection 1 mg (1 mg Intravenous Given 11/23/23 0348)  ondansetron  (ZOFRAN ) injection 4 mg (4 mg Intravenous Given 11/23/23 0348)  lactated ringers  bolus 1,000 mL (0 mLs Intravenous Stopped 11/23/23 0535)  iohexol  (OMNIPAQUE ) 350 MG/ML injection 75 mL (75 mLs Intravenous Contrast Given 11/23/23 0405)  Ampicillin -Sulbactam (UNASYN ) 3 g in sodium chloride  0.9 % 100 mL IVPB (0 g Intravenous Stopped 11/23/23 0620)  oxyCODONE -acetaminophen  (PERCOCET/ROXICET) 5-325 MG per tablet 1 tablet (1 tablet Oral Given 11/23/23 0625)  Medical Decision Making Risk OTC drugs. Prescription drug management.   Labs and CT consistent with likely diverticulitis.  No evidence of complications. VS WNL.  Antibiotics started here and prescription for the same.  Will follow with PCP if not improving return here if anything worsens.     Final diagnoses:  Diverticulitis    ED Discharge Orders          Ordered    amoxicillin -clavulanate (AUGMENTIN ) 875-125 MG tablet  Every 12 hours        11/23/23 0516    oxyCODONE -acetaminophen  (PERCOCET) 5-325 MG tablet  Every 8 hours PRN        11/23/23 0516    docusate sodium  (COLACE) 100 MG capsule  Every 12 hours        11/23/23 0516               Darlen Gledhill, Selinda, MD 11/23/23 2343

## 2024-01-04 ENCOUNTER — Other Ambulatory Visit: Payer: Self-pay

## 2024-01-04 ENCOUNTER — Observation Stay (HOSPITAL_COMMUNITY)
Admission: EM | Admit: 2024-01-04 | Discharge: 2024-01-06 | Disposition: A | Discharge status: Home / Self Care | Attending: Internal Medicine | Admitting: Internal Medicine

## 2024-01-04 ENCOUNTER — Emergency Department (HOSPITAL_COMMUNITY)

## 2024-01-04 ENCOUNTER — Encounter (HOSPITAL_COMMUNITY): Payer: Self-pay

## 2024-01-04 DIAGNOSIS — F1090 Alcohol use, unspecified, uncomplicated: Secondary | ICD-10-CM | POA: Diagnosis not present

## 2024-01-04 DIAGNOSIS — K219 Gastro-esophageal reflux disease without esophagitis: Secondary | ICD-10-CM | POA: Diagnosis not present

## 2024-01-04 DIAGNOSIS — Z8679 Personal history of other diseases of the circulatory system: Secondary | ICD-10-CM | POA: Insufficient documentation

## 2024-01-04 DIAGNOSIS — Z720 Tobacco use: Secondary | ICD-10-CM | POA: Diagnosis present

## 2024-01-04 DIAGNOSIS — R55 Syncope and collapse: Secondary | ICD-10-CM | POA: Insufficient documentation

## 2024-01-04 DIAGNOSIS — Z8719 Personal history of other diseases of the digestive system: Secondary | ICD-10-CM | POA: Insufficient documentation

## 2024-01-04 DIAGNOSIS — J45909 Unspecified asthma, uncomplicated: Secondary | ICD-10-CM | POA: Diagnosis not present

## 2024-01-04 DIAGNOSIS — E876 Hypokalemia: Secondary | ICD-10-CM | POA: Diagnosis not present

## 2024-01-04 DIAGNOSIS — I10 Essential (primary) hypertension: Secondary | ICD-10-CM | POA: Insufficient documentation

## 2024-01-04 DIAGNOSIS — I959 Hypotension, unspecified: Secondary | ICD-10-CM | POA: Diagnosis present

## 2024-01-04 DIAGNOSIS — R634 Abnormal weight loss: Secondary | ICD-10-CM | POA: Insufficient documentation

## 2024-01-04 DIAGNOSIS — D649 Anemia, unspecified: Secondary | ICD-10-CM | POA: Diagnosis not present

## 2024-01-04 DIAGNOSIS — Z87891 Personal history of nicotine dependence: Secondary | ICD-10-CM | POA: Insufficient documentation

## 2024-01-04 DIAGNOSIS — F419 Anxiety disorder, unspecified: Secondary | ICD-10-CM | POA: Diagnosis not present

## 2024-01-04 DIAGNOSIS — R2990 Unspecified symptoms and signs involving the nervous system: Secondary | ICD-10-CM | POA: Diagnosis present

## 2024-01-04 LAB — COMPREHENSIVE METABOLIC PANEL WITH GFR
ALT: 8 U/L (ref 0–44)
AST: 15 U/L (ref 15–41)
Albumin: 3 g/dL — ABNORMAL LOW (ref 3.5–5.0)
Alkaline Phosphatase: 78 U/L (ref 38–126)
Anion gap: 12 (ref 5–15)
BUN: 9 mg/dL (ref 6–20)
CO2: 21 mmol/L — ABNORMAL LOW (ref 22–32)
Calcium: 8.3 mg/dL — ABNORMAL LOW (ref 8.9–10.3)
Chloride: 103 mmol/L (ref 98–111)
Creatinine, Ser: 1.04 mg/dL — ABNORMAL HIGH (ref 0.44–1.00)
GFR, Estimated: >60 mL/min (ref >60)
Glucose, Bld: 103 mg/dL — ABNORMAL HIGH (ref 70–99)
Potassium: 3.4 mmol/L — ABNORMAL LOW (ref 3.5–5.1)
Sodium: 136 mmol/L (ref 135–145)
Total Bilirubin: 0.4 mg/dL (ref 0.0–1.2)
Total Protein: 5.7 g/dL — ABNORMAL LOW (ref 6.5–8.1)

## 2024-01-04 LAB — CBC
HCT: 34.3 % — ABNORMAL LOW (ref 36.0–46.0)
Hemoglobin: 11.2 g/dL — ABNORMAL LOW (ref 12.0–15.0)
MCH: 30.2 pg (ref 26.0–34.0)
MCHC: 32.7 g/dL (ref 30.0–36.0)
MCV: 92.5 fL (ref 80.0–100.0)
Platelets: 262 K/uL (ref 150–400)
RBC: 3.71 MIL/uL — ABNORMAL LOW (ref 3.87–5.11)
RDW: 13.3 % (ref 11.5–15.5)
WBC: 7.8 K/uL (ref 4.0–10.5)
nRBC: 0 % (ref 0.0–0.2)

## 2024-01-04 LAB — CBG MONITORING, ED: Glucose-Capillary: 99 mg/dL (ref 70–99)

## 2024-01-04 MED ORDER — LACTATED RINGERS IV BOLUS
1000.0000 mL | Freq: Once | INTRAVENOUS | Status: AC
  Administered 2024-01-04: 1000 mL via INTRAVENOUS

## 2024-01-04 NOTE — ED Provider Notes (Signed)
 Emergency Department Provider Note  TRIAGE NOTE: Patient BIB [facility] from home due to syncopal episode and hypotension. Patient had 3 witnessed syncopal episodes with EMS. BP initially 70/40 and increased to 89/40 after fluids. Patient denies any pain. Patient states she's had a headache for a couple days. Patient endorses lack of PO intake. A&Ox4. Hx of syncopal episodes.  HISTORY  Chief Complaint Hypotension   HPI Kendra Becker is a 58 y.o. female with a chief complaint of a syncopal episode. The patient has a history of diverticulitis, for which a CT scan was previously performed in the emergency room. The patient reports a significant weight loss over the past year, which previously necessitated a change in blood pressure medication. The current episode involved a blackout while on the phone, with the patient regaining consciousness while seated on the bed. The patient describes a sensation of burning throughout the body and an inability to move the legs during the episode. The patient has been experiencing symptoms suggestive of a cold, possibly contracted from a grandson, and admits to incomplete antibiotic use. The patient reports difficulty eating, consuming only one meal a day, and associates this with a feeling of fullness. The patient drinks a lot of tea and suspects possible dehydration. There is no use of supplemental oxygen at home. The history was obtained from the patient.  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 Authorizing Provider  albuterol  (VENTOLIN  HFA) 108 (90 Base) MCG/ACT inhaler Inhale 1-2 puffs into the lungs every 6 (six) hours as needed for wheezing or  shortness of breath. 09/12/23   Raspet, Erin K, [name]  ALPRAZolam  (XANAX ) 0.5 MG tablet Take 0.25 mg by mouth 2 (two) times daily as needed. Reported on 09/21/2015    [provider]  cetirizine  (ZYRTEC  ALLERGY) 10 MG tablet Take 1 tablet (10 mg total) by mouth daily. 07/16/20   Stuart Vernell Norris, [name]  docusate sodium  (COLACE) 100 MG capsule Take 1 capsule (100 mg total) by mouth every 12 (twelve) hours. 11/23/23   Sayre Witherington, Selinda, MD  escitalopram  (LEXAPRO ) 10 MG tablet Take 1 tablet (10 mg total) by mouth daily. 12/12/19   Wieters, Hallie C, [name]  lisinopril  (ZESTRIL ) 30 MG tablet Take 1 tablet (30 mg total) by mouth every evening. Reported on 09/21/2015 12/12/19   Wieters, Hallie C, [name]  omeprazole  (PRILOSEC) 20 MG capsule Take 1 capsule (20 mg total) by mouth daily. 09/10/21 10/10/21  Kommor, Lum, MD  oxyCODONE -acetaminophen  (PERCOCET) 5-325 MG tablet Take 1 tablet by mouth every 8 (eight) hours as needed for severe pain (pain score 7-10). 11/23/23   Dessie Tatem, Selinda, MD  promethazine -dextromethorphan (PROMETHAZINE -DM) 6.25-15 MG/5ML syrup Take 5 mLs by mouth 3 (three) times daily as needed for cough. 09/12/23   Raspet, Rocky POUR, [name]    Social History Social History   Tobacco Use   Smoking status: Every Day    Current packs/day: 1.50    Average packs/day: 1.5 packs/day for 28.0 years (42.0 ttl pk-yrs)    Types: Cigarettes   Smokeless tobacco: Never  Vaping Use   Vaping status: Never Used  Substance Use Topics   Alcohol use: Yes  Comment: occasional   Drug use: No    Review of Systems: Documented in HPI ____________________________________________  PHYSICAL EXAM: VITAL SIGNS: Triage: Blood pressure (!) 81/62, pulse 82, temperature 97.7 F (36.5 C), temperature source Oral, resp. rate 20, height 5' 9 (1.753 m), weight 68 kg, last menstrual period 09/15/2011, SpO2 91%.  Vitals:   01/04/24 2303 01/04/24 2307  BP:  (!) 81/62  Pulse:  82  Resp:  20  Temp:  97.7 F (36.5 C)   TempSrc:  Oral  SpO2: 94% 91%  Weight:  68 kg  Height:  5' 9 (1.753 m)    Physical Exam Vitals and nursing note reviewed.  Constitutional:      Appearance: She is well-developed.     Comments: In trendelenburg, good color  HENT:     Head: Normocephalic and atraumatic.  Cardiovascular:     Rate and Rhythm: Normal rate and regular rhythm.  Pulmonary:     Effort: No respiratory distress.     Breath sounds: No stridor.  Abdominal:     General: There is no distension.     Tenderness: There is no abdominal tenderness.  Musculoskeletal:        General: No swelling. Normal range of motion.     Cervical back: Normal range of motion.  Skin:    General: Skin is warm and dry.  Neurological:     General: No focal deficit present.     Mental Status: She is alert and oriented to person, place, and time.       ____________________________________________   LABS (all labs ordered are listed, but only abnormal results are displayed)  Labs Reviewed  CBC - Abnormal; Notable for the following components:      Result Value   RBC 3.71 (*)    Hemoglobin 11.2 (*)    HCT 34.3 (*)    All other components within normal limits  COMPREHENSIVE METABOLIC PANEL WITH GFR  URINALYSIS, ROUTINE W REFLEX MICROSCOPIC  CBG MONITORING, ED  CBG MONITORING, ED   ____________________________________________  EKG   EKG Interpretation Date/Time:    Ventricular Rate:    PR Interval:    QRS Duration:    QT Interval:    QTC Calculation:   R Axis:      Text Interpretation:          ____________________________________________  RADIOLOGY  No results found. ____________________________________________  PROCEDURES  Procedure(s) performed:   Procedures ____________________________________________  INITIAL IMPRESSION / ASSESSMENT AND PLAN   Patient experienced a syncopal episode while sitting and describes a sensation of whole-body burning; has a history of diverticulitis and recent  weight loss. Plan:  Check ecg, cbc, cmp, troponin, magnesium. Repeat CT to assess for diverticulitis resolution. Perform chest X-ray to rule out pneumonia. Avoid anything by mouth until CT is completed.  ED Course  Images ordered viewed and obtained by myself. Agree with Radiology interpretation. Details in ED course.  Labs ordered reviewed by myself as detailed in ED course.  Consultations obtained/considered detailed in ED course.        Cardiac Monitoring:  The patient was maintained on a cardiac monitor.  I personally viewed and interpreted the cardiac monitored which showed an underlying rhythm of: ***  CRITICAL INTERVENTIONS:  ***  FINAL IMPRESSION Final diagnoses:  None     ***Medical screening exam was performed and I feel the patient has had appropriate emergency department evaluation and work-up for their chief complaint and is stable for ADMISSION to the hospital at this  time.  I discussed with *** with the *** service and discussed labs, imaging and other work-up in the emergency room.  They agree to admission for further management and work-up of said condition. *** A medical screening exam was performed and I feel the patient has had an appropriate workup for their chief complaint at this time and likelihood of emergent condition existing is low. They have been counseled on decision, DISCHARGE, follow up and which symptoms necessitate immediate return to the emergency department. They or their family verbally stated understanding and agreement with plan and discharged in stable condition.   ____________________________________________   NEW OUTPATIENT MEDICATIONS STARTED DURING THIS VISIT:  New Prescriptions   No medications on file    Note:  This note was prepared with assistance of Dragon voice recognition software. Occasional wrong-word or sound-a-like substitutions may have occurred due to the inherent limitations of voice recognition software.

## 2024-01-04 NOTE — ED Notes (Signed)
 Patient does state she smoked a little weed earlier.

## 2024-01-04 NOTE — ED Triage Notes (Signed)
 Patient BIB [facility] from home due to syncopal episode and hypotension. Patient had 3 witnessed syncopal episodes with EMS. BP initially 70/40 and increased to 89/40 after fluids. Patient denies any pain. Patient states she's had a headache for a couple days. Patient endorses lack of PO intake. A&Ox4. Hx of syncopal episodes.

## 2024-01-04 NOTE — ED Notes (Signed)
 Pt mom called for a update [phone]

## 2024-01-05 ENCOUNTER — Observation Stay (HOSPITAL_COMMUNITY)

## 2024-01-05 ENCOUNTER — Emergency Department (HOSPITAL_COMMUNITY)

## 2024-01-05 ENCOUNTER — Encounter (HOSPITAL_COMMUNITY): Payer: Self-pay | Admitting: Radiology

## 2024-01-05 DIAGNOSIS — R55 Syncope and collapse: Secondary | ICD-10-CM

## 2024-01-05 DIAGNOSIS — D649 Anemia, unspecified: Secondary | ICD-10-CM | POA: Diagnosis not present

## 2024-01-05 DIAGNOSIS — Z8719 Personal history of other diseases of the digestive system: Secondary | ICD-10-CM

## 2024-01-05 DIAGNOSIS — F419 Anxiety disorder, unspecified: Secondary | ICD-10-CM

## 2024-01-05 DIAGNOSIS — E876 Hypokalemia: Secondary | ICD-10-CM

## 2024-01-05 DIAGNOSIS — R2990 Unspecified symptoms and signs involving the nervous system: Secondary | ICD-10-CM | POA: Diagnosis not present

## 2024-01-05 DIAGNOSIS — K219 Gastro-esophageal reflux disease without esophagitis: Secondary | ICD-10-CM

## 2024-01-05 DIAGNOSIS — I959 Hypotension, unspecified: Secondary | ICD-10-CM | POA: Diagnosis not present

## 2024-01-05 DIAGNOSIS — Z8679 Personal history of other diseases of the circulatory system: Secondary | ICD-10-CM

## 2024-01-05 DIAGNOSIS — Z720 Tobacco use: Secondary | ICD-10-CM

## 2024-01-05 DIAGNOSIS — R634 Abnormal weight loss: Secondary | ICD-10-CM

## 2024-01-05 LAB — TROPONIN I (HIGH SENSITIVITY): Troponin I (High Sensitivity): 3 ng/L (ref <18)

## 2024-01-05 LAB — ECHOCARDIOGRAM COMPLETE
AR max vel: 1.52 cm2
AV Peak grad: 12.5 mmHg
Ao pk vel: 1.77 m/s
Area-P 1/2: 4.06 cm2
Height: 69 in
S' Lateral: 2.25 cm
Weight: 2400 [oz_av]

## 2024-01-05 LAB — CBC
HCT: 35.7 % — ABNORMAL LOW (ref 36.0–46.0)
Hemoglobin: 11.4 g/dL — ABNORMAL LOW (ref 12.0–15.0)
MCH: 29.8 pg (ref 26.0–34.0)
MCHC: 31.9 g/dL (ref 30.0–36.0)
MCV: 93.2 fL (ref 80.0–100.0)
Platelets: 162 K/uL (ref 150–400)
RBC: 3.83 MIL/uL — ABNORMAL LOW (ref 3.87–5.11)
RDW: 13.4 % (ref 11.5–15.5)
WBC: 5.8 K/uL (ref 4.0–10.5)
nRBC: 0 % (ref 0.0–0.2)

## 2024-01-05 LAB — URINALYSIS, ROUTINE W REFLEX MICROSCOPIC
Bacteria, UA: NONE SEEN
Bilirubin Urine: NEGATIVE
Glucose, UA: NEGATIVE mg/dL
Ketones, ur: NEGATIVE mg/dL
Nitrite: NEGATIVE
Protein, ur: NEGATIVE mg/dL
Specific Gravity, Urine: 1.002 — ABNORMAL LOW (ref 1.005–1.030)
pH: 6 (ref 5.0–8.0)

## 2024-01-05 LAB — TYPE AND SCREEN
ABO/RH(D): O POS
Antibody Screen: NEGATIVE

## 2024-01-05 LAB — RESP PANEL BY RT-PCR (RSV, FLU A&B, COVID)  RVPGX2
Influenza A by PCR: NEGATIVE
Influenza B by PCR: NEGATIVE
Resp Syncytial Virus by PCR: NEGATIVE
SARS Coronavirus 2 by RT PCR: NEGATIVE

## 2024-01-05 LAB — CORTISOL: Cortisol, Plasma: 5.1 ug/dL

## 2024-01-05 LAB — TSH: TSH: 1.007 u[IU]/mL (ref 0.350–4.500)

## 2024-01-05 LAB — HIV ANTIBODY (ROUTINE TESTING W REFLEX): HIV Screen 4th Generation wRfx: NONREACTIVE

## 2024-01-05 LAB — C-REACTIVE PROTEIN: CRP: 0.5 mg/dL (ref <1.0)

## 2024-01-05 LAB — LIPASE, BLOOD: Lipase: 26 U/L (ref 11–51)

## 2024-01-05 MED ORDER — ESCITALOPRAM OXALATE 20 MG PO TABS
20.0000 mg | ORAL_TABLET | Freq: Every day | ORAL | Status: DC
  Administered 2024-01-05: 20 mg via ORAL
  Filled 2024-01-05: qty 1

## 2024-01-05 MED ORDER — IOHEXOL 350 MG/ML SOLN
75.0000 mL | Freq: Once | INTRAVENOUS | Status: AC | PRN
  Administered 2024-01-05: 75 mL via INTRAVENOUS

## 2024-01-05 MED ORDER — ACETAMINOPHEN 650 MG RE SUPP: 650.0000 mg | Freq: Four times a day (QID) | RECTAL | Status: DC | PRN

## 2024-01-05 MED ORDER — PANTOPRAZOLE SODIUM 40 MG PO TBEC
40.0000 mg | DELAYED_RELEASE_TABLET | Freq: Every day | ORAL | Status: DC
Start: 2024-01-05 — End: 2024-01-06
  Administered 2024-01-05 – 2024-01-06 (×2): 40 mg via ORAL
  Filled 2024-01-05 (×2): qty 1

## 2024-01-05 MED ORDER — LORATADINE 10 MG PO TABS
10.0000 mg | ORAL_TABLET | Freq: Every day | ORAL | Status: DC
  Administered 2024-01-05: 10 mg via ORAL
  Filled 2024-01-05: qty 1

## 2024-01-05 MED ORDER — ONDANSETRON HCL 4 MG PO TABS: 4.0000 mg | ORAL_TABLET | Freq: Four times a day (QID) | ORAL | Status: DC | PRN

## 2024-01-05 MED ORDER — ALBUMIN HUMAN 25 % IV SOLN
12.5000 g | Freq: Once | INTRAVENOUS | Status: AC
  Administered 2024-01-05: 12.5 g via INTRAVENOUS
  Filled 2024-01-05: qty 50

## 2024-01-05 MED ORDER — ALPRAZOLAM 0.5 MG PO TABS
0.5000 mg | ORAL_TABLET | Freq: Every day | ORAL | Status: DC
  Administered 2024-01-05: 0.5 mg via ORAL
  Filled 2024-01-05: qty 1

## 2024-01-05 MED ORDER — LACTATED RINGERS IV BOLUS
1000.0000 mL | Freq: Once | INTRAVENOUS | Status: AC
  Administered 2024-01-05: 1000 mL via INTRAVENOUS

## 2024-01-05 MED ORDER — NICOTINE 21 MG/24HR TD PT24
21.0000 mg | MEDICATED_PATCH | Freq: Every day | TRANSDERMAL | Status: DC
  Administered 2024-01-05 – 2024-01-06 (×2): 21 mg via TRANSDERMAL
  Filled 2024-01-05 (×2): qty 1

## 2024-01-05 MED ORDER — SODIUM CHLORIDE 0.9 % IV SOLN: INTRAVENOUS | Status: DC

## 2024-01-05 MED ORDER — ENOXAPARIN SODIUM 40 MG/0.4ML IJ SOSY
40.0000 mg | PREFILLED_SYRINGE | INTRAMUSCULAR | Status: DC
  Administered 2024-01-05: 40 mg via SUBCUTANEOUS
  Filled 2024-01-05: qty 0.4

## 2024-01-05 MED ORDER — DIAZEPAM 5 MG/ML IJ SOLN: 5.0000 mg | Freq: Once | INTRAMUSCULAR | Status: DC | PRN

## 2024-01-05 MED ORDER — SODIUM CHLORIDE 0.9% FLUSH
3.0000 mL | Freq: Two times a day (BID) | INTRAVENOUS | Status: DC
  Administered 2024-01-05 – 2024-01-06 (×2): 3 mL via INTRAVENOUS

## 2024-01-05 MED ORDER — ONDANSETRON HCL 4 MG/2ML IJ SOLN: 4.0000 mg | Freq: Four times a day (QID) | INTRAMUSCULAR | Status: DC | PRN

## 2024-01-05 MED ORDER — GABAPENTIN 300 MG PO CAPS
300.0000 mg | ORAL_CAPSULE | Freq: Two times a day (BID) | ORAL | Status: DC
  Administered 2024-01-05 – 2024-01-06 (×3): 300 mg via ORAL
  Filled 2024-01-05 (×3): qty 1

## 2024-01-05 MED ORDER — ACETAMINOPHEN 325 MG PO TABS: 650.0000 mg | ORAL_TABLET | Freq: Four times a day (QID) | ORAL | Status: DC | PRN

## 2024-01-05 MED ORDER — POTASSIUM CHLORIDE 10 MEQ/100ML IV SOLN
10.0000 meq | Freq: Once | INTRAVENOUS | Status: AC
  Administered 2024-01-05: 10 meq via INTRAVENOUS
  Filled 2024-01-05: qty 100

## 2024-01-05 NOTE — Care Management (Signed)
  Transition of Care [facility]) Screening Note   Patient Details  Name: [name] Date of Birth: 07-29-1965   Transition of Care [facility]) CM/SW Contact:    Corean JAYSON Canary, RN Phone Number: 01/05/2024, 4:15 PM    Transition of Care Department [facility]) has reviewed patient and no TOC needs have been identified at this time. We will continue to monitor patient advancement through interdisciplinary progression rounds. If new patient transition needs arise, please place a TOC consult.

## 2024-01-05 NOTE — H&P (Signed)
 History and Physical    Patient: Kendra Becker FMW:[id number] DOB: [birth year] DOA: 01/04/2024 DOS: the patient was seen and examined on 01/05/2024 PCP: [facility]  Patient coming from: Home  Chief Complaint:  Chief Complaint  Patient presents with   Hypotension   HPI: ELSY Becker is a 58 y.o. female with medical history significant of hypertension, cerebral aneurysm s/p clipping presents after having a syncopal episode and difficulty speaking and walking.  She experienced a syncopal episode last night while on the phone with her mother. She does not recall whether she was sitting or standing at the time. She blacked out and upon regaining consciousness, she was unable to speak, walk, or move her body normally. She eventually managed to call her mother for help.  She has a history of significant unintended weight loss. She relates a previous similar episode of syncope to weight loss and adjustments in blood pressure medication. She is currently on two blood pressure medications, one for both systolic and diastolic control and another specifically for diastolic pressure. There have been no recent changes in her medications.  She has a history of diverticulitis, for which she completed a course of antibiotics last month. She also has a history of gastrointestinal issues, including a past appendectomy due to appendicitis with complications.  She experiences lightheadedness when transitioning from sitting to standing, which she can usually manage by sitting back down. However, last night's episode occurred too quickly for her to react. No recent gastrointestinal bleeding or changes in stool color.  She smokes one to two packs per day and has requested a nicotine  patch during her hospital stay.  No recent respiratory symptoms despite exposure to family members with cold-like symptoms. No recent diarrhea following a previous episode related to diverticulitis  treatment.  The ED patient was noted to be afebrile with blood pressures remained as low as 63/46 with improvement up to 132/82, respirations elevated up to 25, and all other vital signs maintained.  Labs significant for hemoglobin 11.2, potassium 3.4, BUN 9, creatinine 1.04, calcium 8.3, and albumin  3.  Urinalysis positive for small hemoglobin, large leukocytes, specific gravity 1.002, no bacteria seen, 6-10 RBCs/hpf, and 0-5 WBCs.  CT scan of the head did not reveal any acute abnormality.  CT of the cervical spine without contrast noted degenerative disc and facet disease.  CT angiogram of the chest did not reveal any arterial dilation or embolus and noted emphysema and bronchitis with prominent precarinal and right hilar lymph nodes.  Patient had been bolused 3 L of lactated Ringer 's and given 12.5 g of albumin .  Review of Systems: As mentioned in the history of present illness. All other systems reviewed and are negative. Past Medical History:  Diagnosis Date   Anal warts    Asthma    History of cerebral aneurysm repair    03-17-2006 clipping anterior communicating artery segment, unruptured   History of diverticulitis of colon    Hypertension    Wears glasses    Past Surgical History:  Procedure Laterality Date   LAPAROSCOPIC CHOLECYSTECTOMY  01/18/2003   LAPAROSCOPY APPENDECTOMY AND PARTIAL CECECTOMY  2000   LASER ABLATION CONDOLAMATA N/A 05/20/2016   Procedure: EXCISION AND LASER ABLATION OF ANAL CONDYLOMA;  Surgeon: Krystal Russell, MD;  Location: [facility];  Service: General;  Laterality: N/A;   RIGHT PTERIONAL CRANIOTOMY CLIPPING ANTERIOR COMMUNICATING ARTERY SEGMENT ANEURYSM MICRODISSECTION  03/17/2006   unruptured   Social History:  reports that she has been  smoking cigarettes. She has a 42 pack-year smoking history. She has never used smokeless tobacco. She reports current alcohol use. She reports that she does not use drugs.  Allergies  Allergen Reactions    Flagyl  [Metronidazole ] Itching    redness    Family History  Problem Relation Age of Onset   Hypertension Mother    Aneurysm Father     Prior to Admission medications   Medication Sig Start Date End Date Taking? Authorizing Provider  albuterol  (VENTOLIN  HFA) 108 (90 Base) MCG/ACT inhaler Inhale 1-2 puffs into the lungs every 6 (six) hours as needed for wheezing or shortness of breath. 09/12/23  Yes Raspet, Erin K, [name]  ALPRAZolam  (XANAX ) 0.5 MG tablet Take 0.5 mg by mouth at bedtime.   Yes [provider]  amLODipine (NORVASC) 5 MG tablet Take 5 mg by mouth at bedtime. 11/17/23  Yes [provider]  cetirizine  (ZYRTEC  ALLERGY) 10 MG tablet Take 1 tablet (10 mg total) by mouth daily. Patient taking differently: Take 10 mg by mouth at bedtime. 07/16/20  Yes Stuart Vernell Norris, [name]  escitalopram  (LEXAPRO ) 20 MG tablet Take 20 mg by mouth at bedtime. 12/23/23  Yes [provider]  gabapentin  (NEURONTIN ) 300 MG capsule Take 300 mg by mouth 2 (two) times daily. 12/26/23  Yes [provider]  lisinopril  (ZESTRIL ) 30 MG tablet Take 1 tablet (30 mg total) by mouth every evening. Reported on 09/21/2015 Patient taking differently: Take 30 mg by mouth at bedtime. 12/12/19  Yes Wieters, Hallie C, [name]  ondansetron  (ZOFRAN -ODT) 8 MG disintegrating tablet Take 8 mg by mouth every 8 (eight) hours as needed for vomiting or nausea. 11/19/23  Yes [provider]  pantoprazole  (PROTONIX ) 40 MG tablet Take 40 mg by mouth daily.   Yes [provider]    Physical Exam: Vitals:   01/05/24 0715 01/05/24 0726 01/05/24 0730 01/05/24 0745  BP: 108/78  121/72 98/75  Pulse: 74     Resp: 17  17 18   Temp:  (!) 97.5 F (36.4 C)    TempSrc:  Oral    SpO2: 100%     Weight:      Height:         Constitutional: middle age female currently in NAD, calm, comfortable Eyes: PERRL, lids and conjunctivae normal ENMT: Mucous membranes are moist.  Normal dentition.  Neck:  normal, supple  Respiratory: clear to auscultation bilaterally, no wheezing, no crackles. Normal respiratory effort. No accessory muscle use.  Cardiovascular: Regular rate and rhythm, no murmurs / rubs / gallops.    Abdomen: no tenderness, no masses palpated. No hepatosplenomegaly. Bowel sounds positive.  Musculoskeletal: no clubbing / cyanosis. No joint deformity upper and lower extremities. Good ROM, no contractures. Normal muscle tone.  Skin: no rashes, lesions, ulcers. No induration Neurologic: CN 2-12 grossly intact. Sensation intact, DTR normal. Strength 5/5 in all 4.  Psychiatric: Normal judgment and insight. Alert and oriented x 3. Normal mood.    Data Reviewed:  EKG reveals sinus rhythm at 80 bpm.  Reviewed labs, imaging, and pertinent records as documented  Assessment and Plan:  Syncope Hypotension Acute.  Patient presents after having syncopal episode with blood pressure as low as 63/46.  CT scanning of the head did not reveal any acute abnormality.  CT angiogram of the chest abdomen pelvis was obtained which did not reveal any signs for arterial dilation or embolus, or any other acute cause for patient's symptoms.  Blood pressure seemed to improve after 3  L bolus of IV fluids and albumin . - Admit to a telemetry bed - Goal MAP greater than 65 - Follow-up cortisol level - Check TSH and CRP - Hold home blood pressure regimen amlodipine and lisinopril   Stroke-like symptoms Patient reported being unable to speak, walk, or move following episode of syncope.  Currently patient able to speak and feels like she is almost back to her baseline. - Neurochecks - Check MRI/MRA of the brain - PT/OT to evaluate and treat  Normocytic anemia Hemoglobin noted to be 11.2.  Baseline hemoglobin previously had been 12.7 when checked on 11/22/2023.  Patient denied any reports of bleeding. - Type and screen for possible need for blood products and recheck CBC  Hypokalemia Acute.  Initial  potassium 3.4.  Patient had been given 10 mEq of potassium chloride  IV. - Continue to monitor and replace as needed  History of diverticulitis Patient was treated for sigmoid diverticulitis last month.  Denies having any complaints of abdominal pain or diarrhea at this time.  Patient is followed by Dr. Kristie gastroenterology in the outpatient setting.  Anxiety - Continue Lexapro  and Xanax  nightly  History of cerebral aneurysm S/p an anterior communicating artery clipping in 2007  Weight loss Patient reports significant amount of weight loss that seems to be unintentional.  CT imaging of the chest did not clear cause for symptoms. - Check TSH - Recommend outpatient follow-up to ensure she is up-to-date on routine screenings and follow-up  Tobacco abuse Patient reports smoking and 1 to 2 packs of cigarettes per day on average. - Nicotine  patch - Counseled on the need for cessation of tobacco  GERD - Continue Protonix   DVT prophylaxis: Lovenox  Advance Care Planning:   Code Status: Full Code   Consults: None  Family Communication: Patient's mother updated over the phone  Severity of Illness: The appropriate patient status for this patient is OBSERVATION. Observation status is judged to be reasonable and necessary in order to provide the required intensity of service to ensure the patient's safety. The patient's presenting symptoms, physical exam findings, and initial radiographic and laboratory data in the context of their medical condition is felt to place them at decreased risk for further clinical deterioration. Furthermore, it is anticipated that the patient will be medically stable for discharge from the hospital within 2 midnights of admission.   Author: Maximino DELENA Sharps, MD 01/05/2024 8:12 AM  For on call review [email or web address].

## 2024-01-05 NOTE — Plan of Care (Signed)
  Problem: Clinical Measurements: Goal: Diagnostic test results will improve Outcome: Progressing Goal: Respiratory complications will improve Outcome: Progressing   Problem: Coping: Goal: Level of anxiety will decrease Outcome: Progressing

## 2024-01-05 NOTE — ED Notes (Signed)
 Pt bp low at

## 2024-01-05 NOTE — Progress Notes (Signed)
  Echocardiogram 2D Echocardiogram has been performed.  Juliene JINNY Rucks 01/05/2024, 4:56 PM

## 2024-01-05 NOTE — ED Notes (Signed)
 Pt bp 77/56 advised MD Mesner new orders placed. Pt alert and oriented slightly drowsy no c/o of dizziness at this time.

## 2024-01-05 NOTE — ED Notes (Signed)
 CCMD called.

## 2024-01-05 NOTE — Progress Notes (Signed)
 Patient refused to have orthostatic vitals for now stated she is too tired and sleepy to get up.Secure chat sent to MD.

## 2024-01-05 NOTE — Progress Notes (Signed)
 Patient refused to have orthostatic vitals for now telling she is too sleepy right now and wants to rest and will call after.

## 2024-01-05 NOTE — ED Notes (Signed)
 Pt bp 71/51 MD Mesner made aware, completed bedside assessment.

## 2024-01-06 ENCOUNTER — Other Ambulatory Visit (HOSPITAL_COMMUNITY): Payer: Self-pay

## 2024-01-06 DIAGNOSIS — I959 Hypotension, unspecified: Secondary | ICD-10-CM | POA: Diagnosis not present

## 2024-01-06 LAB — BASIC METABOLIC PANEL WITH GFR
Anion gap: 9 (ref 5–15)
BUN: 5 mg/dL — ABNORMAL LOW (ref 6–20)
CO2: 24 mmol/L (ref 22–32)
Calcium: 9 mg/dL (ref 8.9–10.3)
Chloride: 107 mmol/L (ref 98–111)
Creatinine, Ser: 0.92 mg/dL (ref 0.44–1.00)
GFR, Estimated: >60 mL/min (ref >60)
Glucose, Bld: 87 mg/dL (ref 70–99)
Potassium: 3.7 mmol/L (ref 3.5–5.1)
Sodium: 140 mmol/L (ref 135–145)

## 2024-01-06 LAB — CBC
HCT: 35.9 % — ABNORMAL LOW (ref 36.0–46.0)
Hemoglobin: 12.1 g/dL (ref 12.0–15.0)
MCH: 30.4 pg (ref 26.0–34.0)
MCHC: 33.7 g/dL (ref 30.0–36.0)
MCV: 90.2 fL (ref 80.0–100.0)
Platelets: 238 K/uL (ref 150–400)
RBC: 3.98 MIL/uL (ref 3.87–5.11)
RDW: 13.6 % (ref 11.5–15.5)
WBC: 4.7 K/uL (ref 4.0–10.5)
nRBC: 0 % (ref 0.0–0.2)

## 2024-01-06 LAB — ACTH STIMULATION, 3 TIME POINTS
Cortisol, 30 Min: 28.1 ug/dL
Cortisol, 60 Min: 31.9 ug/dL
Cortisol, Base: 19.2 ug/dL

## 2024-01-06 LAB — CORTISOL: Cortisol, Plasma: 31 ug/dL

## 2024-01-06 MED ORDER — BLOOD PRESSURE MONITOR MISC
0 refills | Status: AC
  Filled 2024-01-06: qty 1, 30d supply, fill #0

## 2024-01-06 MED ORDER — COSYNTROPIN 0.25 MG IJ SOLR
0.2500 mg | Freq: Once | INTRAMUSCULAR | Status: AC
  Administered 2024-01-06: 0.25 mg via INTRAVENOUS
  Filled 2024-01-06 (×2): qty 0.25

## 2024-01-06 NOTE — Plan of Care (Signed)

## 2024-01-06 NOTE — Discharge Summary (Signed)
 Physician Discharge Summary  Kendra Becker FMW:[id number] DOB: [birth year] DOA: 01/04/2024  PCP: [facility]  Admit date: 01/04/2024 Discharge date: 01/06/2024  Admitted From: (Home) Disposition:  (Home )  Recommendations for Outpatient Follow-up:  Follow up with PCP in 1-2 weeks Please obtain BMP/CBC in one week   Diet recommendation: Heart Healthy   Brief/Interim Summary:  Kendra Becker is a 58 y.o. female with medical history significant of hypertension, cerebral aneurysm s/p clipping presents after having a syncopal episode and difficulty speaking and walking.   She experienced a syncopal episode last night while on the phone with her mother. She does not recall whether she was sitting or standing at the time. She blacked out and upon regaining consciousness, she was unable to speak, walk, or move her body normally. She eventually managed to call her mother for help.   She has a history of significant unintended weight loss. She relates a previous similar episode of syncope to weight loss and adjustments in blood pressure medication. She is currently on two blood pressure medications, one for both systolic and diastolic control and another specifically for diastolic pressure. There have been no recent changes in her medications.   She has a history of diverticulitis, for which she completed a course of antibiotics last month. She also has a history of gastrointestinal issues, including a past appendectomy due to appendicitis with complications.   She experiences lightheadedness when transitioning from sitting to standing, which she can usually manage by sitting back down. However, last night's episode occurred too quickly for her to react. No recent gastrointestinal bleeding or changes in stool color.   She smokes one to two packs per day and has requested a nicotine  patch during her hospital stay.   No recent respiratory symptoms despite exposure to  family members with cold-like symptoms. No recent diarrhea following a previous episode related to diverticulitis treatment.   The ED patient was noted to be afebrile with blood pressures remained as low as 63/46 with improvement up to 132/82, respirations elevated up to 25, and all other vital signs maintained.  Labs significant for hemoglobin 11.2, potassium 3.4, BUN 9, creatinine 1.04, calcium 8.3, and albumin  3.  Urinalysis positive for small hemoglobin, large leukocytes, specific gravity 1.002, no bacteria seen, 6-10 RBCs/hpf, and 0-5 WBCs.  CT scan of the head did not reveal any acute abnormality.  CT of the cervical spine without contrast noted degenerative disc and facet disease.  CT angiogram of the chest did not reveal any arterial dilation or embolus and noted emphysema and bronchitis with prominent precarinal and right hilar lymph nodes.  Patient had been bolused 3 L of lactated Ringer 's and given 12.5 g of albumin .    Syncope Hypotension Acute.  Patient presents after having syncopal episode with blood pressure as low as 63/46.  CT scanning of the head did not reveal any acute abnormality.  CT angiogram of the chest abdomen pelvis was obtained which did not reveal any signs for arterial dilation or embolus, or any other acute cause for patient's symptoms.  She received IV fluid bolus in ED despite that blood pressure remained low, so she was admitted for further management, she received IV albumin  as well, her TSH within normal limit, cortisol level was low at 5.1, so concentric contest has been done, with good response with cortisol plasma up to 31 in 60 minutes, so this rules out adrenal insufficienc, - Overall her workup is reassuring including telemetry monitoring with  no bradycardias, pauses or arrhythmias, 2D echo with no significant findings, recent imaging loading CT abdomen pelvis last month, as well as recent imaging this admission with no significant findings.. - Most likely her  hypertension is in the setting of her being into antihypertensive medications, with significant recent weight loss, so I have instructed her to stop her antihypertensive medications.    Stroke-like symptoms>> due to syncope, stroke ruled out -MRI brain, MRA head is reassuring    Normocytic anemia Hemoglobin is stable   Hypokalemia Placed   History of diverticulitis Patient was treated for sigmoid diverticulitis last month.  Denies having any complaints of abdominal pain or diarrhea at this time.  Patient is followed by Dr. Kristie gastroenterology in the outpatient setting.   Anxiety - Continue Lexapro  and Xanax  nightly   History of cerebral aneurysm S/p an anterior communicating artery clipping in 2007   Weight loss Patient reports significant amount of weight loss that seems to be unintentional.  CT imaging of the chest did not clear cause for symptoms. -TSH within normal limit - Recommend outpatient follow-up to ensure she is up-to-date on routine screenings and follow-up   Tobacco abuse Patient reports smoking and 1 to 2 packs of cigarettes per day on average. - Nicotine  patch - Counseled on the need for cessation of tobacco   GERD - Continue Protonix    Discharge Diagnoses:  Principal Problem:   Hypotension Active Problems:   Syncope and collapse   Stroke-like symptoms   Normocytic anemia   Hypokalemia   History of diverticulitis   Anxiety   History of cerebral aneurysm   Weight loss   Tobacco abuse   GERD (gastroesophageal reflux disease)    Discharge Instructions  Discharge Instructions     Diet - low sodium heart healthy   Complete by: As directed    Discharge instructions   Complete by: As directed    Follow with Primary MD [facility] in 7 days   Get CBC, CMP,  checked  by Primary MD next visit.    Activity: As tolerated with Full fall precautions use walker/cane & assistance as needed   Disposition Home     Diet: Heart Healthy    On your next visit with your primary care physician please Get Medicines reviewed and adjusted.   Please request your Prim.MD to go over all Hospital Tests and Procedure/Radiological results at the follow up, please get all Hospital records sent to your Prim MD by signing hospital release before you go home.   If you experience worsening of your admission symptoms, develop shortness of breath, life threatening emergency, suicidal or homicidal thoughts you must seek medical attention immediately by calling 911 or calling your MD immediately  if symptoms less severe.  You Must read complete instructions/literature along with all the possible adverse reactions/side effects for all the Medicines you take and that have been prescribed to you. Take any new Medicines after you have completely understood and accpet all the possible adverse reactions/side effects.   Do not drive, operating heavy machinery, perform activities at heights, swimming or participation in water activities or provide baby sitting services if your were admitted for syncope or siezures until you have seen by Primary MD or a Neurologist and advised to do so again.  Do not drive when taking Pain medications.    Do not take more than prescribed Pain, Sleep and Anxiety Medications  Special Instructions: If you have smoked or chewed Tobacco  in the  last 2 yrs please stop smoking, stop any regular Alcohol  and or any Recreational drug use.  Wear Seat belts while driving.   Please note  You were cared for by a hospitalist during your hospital stay. If you have any questions about your discharge medications or the care you received while you were in the hospital after you are discharged, you can call the unit and asked to speak with the hospitalist on call if the hospitalist that took care of you is not available. Once you are discharged, your primary care physician will handle any further medical  issues. Please note that NO REFILLS for any discharge medications will be authorized once you are discharged, as it is imperative that you return to your primary care physician (or establish a relationship with a primary care physician if you do not have one) for your aftercare needs so that they can reassess your need for medications and monitor your lab values.   Increase activity slowly   Complete by: As directed       Allergies as of 01/06/2024       Reactions   Flagyl  [metronidazole ] Itching   redness        Medication List     STOP taking these medications    amLODipine 5 MG tablet Commonly known as: NORVASC   lisinopril  30 MG tablet Commonly known as: ZESTRIL        TAKE these medications    albuterol  108 (90 Base) MCG/ACT inhaler Commonly known as: VENTOLIN  HFA Inhale 1-2 puffs into the lungs every 6 (six) hours as needed for wheezing or shortness of breath.   ALPRAZolam  0.5 MG tablet Commonly known as: XANAX  Take 0.5 mg by mouth at bedtime.   Blood Pressure Cuff Misc Dispense 1 machine   cetirizine  10 MG tablet Commonly known as: ZyrTEC  Allergy Take 1 tablet (10 mg total) by mouth daily. What changed: when to take this   escitalopram  20 MG tablet Commonly known as: LEXAPRO  Take 20 mg by mouth at bedtime.   gabapentin  300 MG capsule Commonly known as: NEURONTIN  Take 300 mg by mouth 2 (two) times daily.   ondansetron  8 MG disintegrating tablet Commonly known as: ZOFRAN -ODT Take 8 mg by mouth every 8 (eight) hours as needed for vomiting or nausea.   pantoprazole  40 MG tablet Commonly known as: PROTONIX  Take 40 mg by mouth daily.        Allergies  Allergen Reactions   Flagyl  [Metronidazole ] Itching    redness    Consultations: none   Procedures/Studies: MR BRAIN WO CONTRAST Result Date: 01/06/2024 CLINICAL DATA:  Stroke, follow up; Cerebral aneurysm, previously treated EXAM: MRI HEAD WITHOUT CONTRAST MRA HEAD WITHOUT CONTRAST  TECHNIQUE: Multiplanar, multi-echo pulse sequences of the brain and surrounding structures were acquired without intravenous contrast. Angiographic images of the Circle of Willis were acquired using MRA technique without intravenous contrast. COMPARISON:  CT head 01/04/2024. FINDINGS: MRI HEAD FINDINGS Brain: No acute infarction, hemorrhage, hydrocephalus, extra-axial collection or mass lesion. Vascular: Artifact from the patient's aneurysm clip in the right paraclinoid region. Skull and upper cervical spine: Normal marrow signal. Sinuses/Orbits: No acute or significant finding. MRA HEAD FINDINGS Mildly motion limited study. Anterior circulation: The visualized intracranial ICA, MCAs, and ACAs are patent without proximal high-grade stenosis. Artifact from aneurysm clip obscures the paraclinoid right ICA, proximal right M1 MCA and right A1 and proximal A2 ACAs. Artifact makes evaluation for recurrent aneurysm nondiagnostic. Posterior circulation: Bilateral intradural vertebral arteries, basilar artery and bilateral  posterior cerebral arteries are patent the proximal hemodynamically significant stenosis. IMPRESSION: 1. No evidence of acute intracranial abnormality. 2. No large vessel occlusion or visible proximal high-grade stenosis within the limitations described above. 3. Artifact from the patient's aneurysm clip makes evaluation for recurrent aneurysm nondiagnostic. Electronically Signed   By: Gilmore GORMAN Molt M.D.   On: 01/06/2024 02:29   MR ANGIO HEAD WO CONTRAST Result Date: 01/06/2024 CLINICAL DATA:  Stroke, follow up; Cerebral aneurysm, previously treated EXAM: MRI HEAD WITHOUT CONTRAST MRA HEAD WITHOUT CONTRAST TECHNIQUE: Multiplanar, multi-echo pulse sequences of the brain and surrounding structures were acquired without intravenous contrast. Angiographic images of the Circle of Willis were acquired using MRA technique without intravenous contrast. COMPARISON:  CT head 01/04/2024. FINDINGS: MRI HEAD  FINDINGS Brain: No acute infarction, hemorrhage, hydrocephalus, extra-axial collection or mass lesion. Vascular: Artifact from the patient's aneurysm clip in the right paraclinoid region. Skull and upper cervical spine: Normal marrow signal. Sinuses/Orbits: No acute or significant finding. MRA HEAD FINDINGS Mildly motion limited study. Anterior circulation: The visualized intracranial ICA, MCAs, and ACAs are patent without proximal high-grade stenosis. Artifact from aneurysm clip obscures the paraclinoid right ICA, proximal right M1 MCA and right A1 and proximal A2 ACAs. Artifact makes evaluation for recurrent aneurysm nondiagnostic. Posterior circulation: Bilateral intradural vertebral arteries, basilar artery and bilateral posterior cerebral arteries are patent the proximal hemodynamically significant stenosis. IMPRESSION: 1. No evidence of acute intracranial abnormality. 2. No large vessel occlusion or visible proximal high-grade stenosis within the limitations described above. 3. Artifact from the patient's aneurysm clip makes evaluation for recurrent aneurysm nondiagnostic. Electronically Signed   By: Gilmore GORMAN Molt M.D.   On: 01/06/2024 02:29   ECHOCARDIOGRAM COMPLETE Result Date: 01/05/2024    ECHOCARDIOGRAM REPORT   Patient Name:   LUISE YAMAMOTO Date of Exam: 01/05/2024 Medical Rec #:  [id number]       Height:       69.0 in Accession #:    [id number]      Weight:       150.0 lb Date of Birth:  1966/03/13        BSA:          1.828 m Patient Age:    58 years        BP:           128/82 mmHg Patient Gender: F               HR:           75 bpm. Exam Location:  Inpatient Procedure: 2D Echo, Color Doppler and Cardiac Doppler (Both Spectral and Color            Flow Doppler were utilized during procedure). Indications:    Syncope  History:        Patient has no prior history of Echocardiogram examinations.                 Signs/Symptoms:Hypotension and Syncope; Risk                 Factors:Hypertension and  Current Smoker.  Sonographer:    Juliene Rucks Sonographer#2:  Thea Norlander Referring Phys: [id number] RONDELL A SMITH IMPRESSIONS  1. Left ventricular ejection fraction, by estimation, is 60 to 65%. The left ventricle has normal function. The left ventricle has no regional wall motion abnormalities. Left ventricular diastolic parameters were normal.  2. Right ventricular systolic function is normal. The right ventricular size is normal.  3. The mitral valve is normal  in structure. No evidence of mitral valve regurgitation. No evidence of mitral stenosis.  4. The aortic valve is tricuspid. There is mild calcification of the aortic valve. Aortic valve regurgitation is not visualized. Aortic valve sclerosis/calcification is present, without any evidence of aortic stenosis.  5. The inferior vena cava is dilated in size with >50% respiratory variability, suggesting right atrial pressure of 8 mmHg. FINDINGS  Left Ventricle: Left ventricular ejection fraction, by estimation, is 60 to 65%. The left ventricle has normal function. The left ventricle has no regional wall motion abnormalities. The left ventricular internal cavity size was normal in size. There is  no left ventricular hypertrophy. Left ventricular diastolic parameters were normal. Right Ventricle: The right ventricular size is normal. No increase in right ventricular wall thickness. Right ventricular systolic function is normal. Left Atrium: Left atrial size was normal in size. Right Atrium: Right atrial size was normal in size. Pericardium: There is no evidence of pericardial effusion. Presence of epicardial fat layer. Mitral Valve: The mitral valve is normal in structure. No evidence of mitral valve regurgitation. No evidence of mitral valve stenosis. Tricuspid Valve: The tricuspid valve is normal in structure. Tricuspid valve regurgitation is trivial. No evidence of tricuspid stenosis. Aortic Valve: The aortic valve is tricuspid. There is mild calcification  of the aortic valve. Aortic valve regurgitation is not visualized. Aortic valve sclerosis/calcification is present, without any evidence of aortic stenosis. Aortic valve peak gradient measures 12.5 mmHg. Pulmonic Valve: The pulmonic valve was normal in structure. Pulmonic valve regurgitation is trivial. No evidence of pulmonic stenosis. Aorta: The aortic root is normal in size and structure. Venous: The inferior vena cava is dilated in size with greater than 50% respiratory variability, suggesting right atrial pressure of 8 mmHg. IAS/Shunts: No atrial level shunt detected by color flow Doppler.  LEFT VENTRICLE PLAX 2D LVIDd:         3.80 cm   Diastology LVIDs:         2.25 cm   LV e' medial:    8.70 cm/s LV PW:         0.95 cm   LV E/e' medial:  11.0 LV IVS:        1.15 cm   LV e' lateral:   8.81 cm/s LVOT diam:     1.80 cm   LV E/e' lateral: 10.8 LV SV:         60 LV SV Index:   33 LVOT Area:     2.54 cm  RIGHT VENTRICLE            IVC RV S prime:     7.70 cm/s  IVC diam: 2.10 cm LEFT ATRIUM           Index        RIGHT ATRIUM          Index LA diam:      2.50 cm 1.37 cm/m   RA Area:     9.49 cm LA Vol (A4C): 25.0 ml 13.67 ml/m  RA Volume:   17.90 ml 9.79 ml/m  AORTIC VALVE AV Area (Vmax): 1.52 cm AV Vmax:        177.00 cm/s AV Peak Grad:   12.5 mmHg LVOT Vmax:      106.00 cm/s LVOT Vmean:     71.800 cm/s LVOT VTI:       0.234 m  AORTA Ao Root diam: 2.90 cm MITRAL VALVE MV Area (PHT): 4.06 cm    SHUNTS MV Decel  Time: 187 msec    Systemic VTI:  0.23 m MV E velocity: 95.40 cm/s  Systemic Diam: 1.80 cm MV A velocity: 75.50 cm/s MV E/A ratio:  1.26 Toribio Fuel MD Electronically signed by Toribio Fuel MD Signature Date/Time: 01/05/2024/6:08:58 PM    Final    CT Angio Chest PE W and/or Wo Contrast Result Date: 01/05/2024 CLINICAL DATA:  Syncopal episodes and hypotension. No acute findings with noncontrast chest CT. Suspected pulmonary embolism. EXAM: CT ANGIOGRAPHY CHEST WITH CONTRAST TECHNIQUE:  Multidetector CT imaging of the chest was performed using the standard protocol during bolus administration of intravenous contrast. Multiplanar CT image reconstructions and MIPs were obtained to evaluate the vascular anatomy. RADIATION DOSE REDUCTION: This exam was performed according to the departmental dose-optimization program which includes automated exposure control, adjustment of the mA and/or kV according to patient size and/or use of iterative reconstruction technique. CONTRAST:  75mL OMNIPAQUE  IOHEXOL  350 MG/ML SOLN COMPARISON:  Chest, abdomen and pelvis CT without contrast yesterday at 11:55 p.m., CT abdomen and pelvis with IV contrast 11/23/2023, and chest CT with contrast 06/10/2016. FINDINGS: Cardiovascular: The cardiac size is normal. There are scattered three-vessel coronary calcifications. No pericardial effusion. The pulmonary arteries are normal in caliber with no evidence of arterial embolism. Pulmonary veins are nondistended. There is a 4 vessel aortic arch with the last branch an aberrant retroesophageal right subclavian artery. There is atherosclerosis in the aorta and great vessels without aneurysm, stenosis or dissection. Mediastinum/Nodes: Prominent precarinal lymph node to the right is 1.1 cm on 9:71. Similar size right mid hilar lymph node on 9:78. No other intrathoracic adenopathy is seen. Axillary spaces are clear. Negative thyroid gland, thoracic trachea, thoracic esophagus. Lungs/Pleura: The lungs are moderately emphysematous with centrilobular changes predominating. There is biapical pleural-parenchymal scarring. There is diffuse bronchial thickening. There is no consolidation, effusion or appreciable nodules. Upper Abdomen: Status post cholecystectomy. Abdominal aortic atherosclerosis. Contrast in the collecting systems of the kidneys. No acute abnormality. Musculoskeletal: Bilateral submuscular breast implants both appear intact. No chest wall mass is seen. No acute or significant  osseous findings. Review of the MIP images confirms the above findings. IMPRESSION: 1. No evidence of arterial dilatation or embolus. 2. Aortic and coronary artery atherosclerosis. 3. Aberrant retroesophageal right subclavian artery. 4. Emphysema and bronchitis. 5. Prominent precarinal and right hilar lymph nodes. No bulky or encasing adenopathy. Aortic Atherosclerosis (ICD10-I70.0) and Emphysema (ICD10-J43.9). Electronically Signed   By: Francis Quam M.D.   On: 01/05/2024 06:15   CT Cervical Spine Wo Contrast Result Date: 01/05/2024 CLINICAL DATA:  Headache, neck trauma, syncope EXAM: CT CERVICAL SPINE WITHOUT CONTRAST TECHNIQUE: Multidetector CT imaging of the cervical spine was performed without intravenous contrast. Multiplanar CT image reconstructions were also generated. RADIATION DOSE REDUCTION: This exam was performed according to the departmental dose-optimization program which includes automated exposure control, adjustment of the mA and/or kV according to patient size and/or use of iterative reconstruction technique. COMPARISON:  None Available. FINDINGS: Alignment: No subluxation. Skull base and vertebrae: No acute fracture. No primary bone lesion or focal pathologic process. Soft tissues and spinal canal: No prevertebral fluid or swelling. No visible canal hematoma. Disc levels: Disc space narrowing and spurring at C5-6 and C6-7. Mild bilateral degenerative facet disease. No disc herniation. Upper chest: Biapical scarring. Other: None IMPRESSION: Degenerative disc and facet disease. No acute bony abnormality. Electronically Signed   By: Franky Crease M.D.   On: 01/05/2024 00:21   CT CHEST ABDOMEN PELVIS WO CONTRAST Result Date: 01/05/2024 CLINICAL  DATA:  Sepsis.  Syncope. EXAM: CT CHEST, ABDOMEN AND PELVIS WITHOUT CONTRAST TECHNIQUE: Multidetector CT imaging of the chest, abdomen and pelvis was performed following the standard protocol without IV contrast. RADIATION DOSE REDUCTION: This exam was  performed according to the departmental dose-optimization program which includes automated exposure control, adjustment of the mA and/or kV according to patient size and/or use of iterative reconstruction technique. COMPARISON:  11/23/2023 FINDINGS: CT CHEST FINDINGS Cardiovascular: Heart is normal size. Aorta is normal caliber. Scattered coronary artery and aortic atherosclerosis. Mediastinum/Nodes: Borderline lower right paratracheal lymph node with a short axis diameter of 11 mm. No axillary or hilar adenopathy. Trachea and esophagus are unremarkable. Thyroid unremarkable. Lungs/Pleura: Moderate emphysema. Biapical scarring. No acute confluent opacities or effusions. Musculoskeletal: Bilateral breast implants. Chest wall soft tissues are unremarkable. No acute bony abnormality. CT ABDOMEN PELVIS FINDINGS Hepatobiliary: No focal liver abnormality is seen. Status post cholecystectomy. No biliary dilatation. Pancreas: No focal abnormality or ductal dilatation. Spleen: No focal abnormality.  Normal size. Adrenals/Urinary Tract: No adrenal abnormality. No focal renal abnormality. No stones or hydronephrosis. Urinary bladder is unremarkable. Stomach/Bowel: Sigmoid diverticulosis. No active diverticulitis. Stomach and small bowel decompressed. No bowel obstruction or inflammatory process. Vascular/Lymphatic: Aortic atherosclerosis. No evidence of aneurysm or adenopathy. Reproductive: Prior hysterectomy.  No adnexal masses. Other: No free fluid or free air. Musculoskeletal: No acute bony abnormality. IMPRESSION: Moderate emphysema. Biapical scarring. Sigmoid diverticulosis. No active diverticulitis. Coronary artery disease, aortic atherosclerosis. No acute findings in the chest, abdomen or pelvis. Electronically Signed   By: Franky Crease M.D.   On: 01/05/2024 00:20   CT Head Wo Contrast Result Date: 01/05/2024 CLINICAL DATA:  Headache, syncope EXAM: CT HEAD WITHOUT CONTRAST TECHNIQUE: Contiguous axial images were  obtained from the base of the skull through the vertex without intravenous contrast. RADIATION DOSE REDUCTION: This exam was performed according to the departmental dose-optimization program which includes automated exposure control, adjustment of the mA and/or kV according to patient size and/or use of iterative reconstruction technique. COMPARISON:  03/18/2006 FINDINGS: Brain: No acute intracranial abnormality. Specifically, no hemorrhage, hydrocephalus, mass lesion, acute infarction, or significant intracranial injury. Vascular: Prior aneurysm clipping in the right suprasellar region. No unexpected hyperdense vessel. Skull: Prior right frontal craniotomy. No acute calvarial abnormality. Sinuses/Orbits: No acute findings Other: None IMPRESSION: No acute intracranial abnormality. Electronically Signed   By: Franky Crease M.D.   On: 01/05/2024 00:15      Subjective: No significant events overnight, she had a good night sleep, she ambulated in the hallway today with no dizziness or lightheadedness  Discharge Exam: Vitals:   01/06/24 0726 01/06/24 1159  BP: (!) 126/102   Pulse:    Resp:    Temp: 98.4 F (36.9 C) 98.6 F (37 C)  SpO2:     Vitals:   01/06/24 0012 01/06/24 0452 01/06/24 0726 01/06/24 1159  BP: 134/87  (!) 126/102   Pulse: 79     Resp: 20     Temp: 97.7 F (36.5 C) 97.8 F (36.6 C) 98.4 F (36.9 C) 98.6 F (37 C)  TempSrc: Oral Oral Oral Oral  SpO2:  94%    Weight:      Height:        General: Pt is alert, awake, not in acute distress Cardiovascular: RRR, S1/S2 +, no rubs, no gallops Respiratory: CTA bilaterally, no wheezing, no rhonchi Abdominal: Soft, NT, ND, bowel sounds + Extremities: no edema, no cyanosis    The results of significant diagnostics from this hospitalization (  including imaging, microbiology, ancillary and laboratory) are listed below for reference.     Microbiology: Recent Results (from the past 240 hours)  Resp panel by RT-PCR (RSV, Flu  A&B, Covid) Anterior Nasal Swab     Status: None   Collection Time: 01/05/24  1:05 PM   Specimen: Anterior Nasal Swab  Result Value Ref Range Status   SARS Coronavirus 2 by RT PCR NEGATIVE NEGATIVE Final   Influenza A by PCR NEGATIVE NEGATIVE Final   Influenza B by PCR NEGATIVE NEGATIVE Final    Comment: (NOTE) The Xpert Xpress SARS-CoV-2/FLU/RSV plus assay is intended as an aid in the diagnosis of influenza from Nasopharyngeal swab specimens and should not be used as a sole basis for treatment. Nasal washings and aspirates are unacceptable for Xpert Xpress SARS-CoV-2/FLU/RSV testing.  Fact Sheet for Patients: BloggerCourse.com  Fact Sheet for Healthcare Providers: SeriousBroker.it  This test is not yet approved or cleared by the [place]  FDA and has been authorized for detection and/or diagnosis of SARS-CoV-2 by FDA under an Emergency Use Authorization (EUA). This EUA will remain in effect (meaning this test can be used) for the duration of the COVID-19 declaration under Section 564(b)(1) of the Act, 21 U.S.C. section 360bbb-3(b)(1), unless the authorization is terminated or revoked.     Resp Syncytial Virus by PCR NEGATIVE NEGATIVE Final    Comment: (NOTE) Fact Sheet for Patients: BloggerCourse.com  Fact Sheet for Healthcare Providers: SeriousBroker.it  This test is not yet approved or cleared by the [place]  FDA and has been authorized for detection and/or diagnosis of SARS-CoV-2 by FDA under an Emergency Use Authorization (EUA). This EUA will remain in effect (meaning this test can be used) for the duration of the COVID-19 declaration under Section 564(b)(1) of the Act, 21 U.S.C. section 360bbb-3(b)(1), unless the authorization is terminated or revoked.  Performed at [facility], [address] [place], [place] [address]      Labs: BNP (last 3  results) No results for input(s): BNP in the last 8760 hours. Basic Metabolic Panel: Recent Labs  Lab 01/04/24 2314 01/06/24 0600  NA 136 140  K 3.4* 3.7  CL 103 107  CO2 21* 24  GLUCOSE 103* 87  BUN 9 5*  CREATININE 1.04* 0.92  CALCIUM 8.3* 9.0   Liver Function Tests: Recent Labs  Lab 01/04/24 2314  AST 15  ALT 8  ALKPHOS 78  BILITOT 0.4  PROT 5.7*  ALBUMIN  3.0*   Recent Labs  Lab 01/05/24 0929  LIPASE 26   No results for input(s): AMMONIA in the last 168 hours. CBC: Recent Labs  Lab 01/04/24 2314 01/05/24 0929 01/06/24 0600  WBC 7.8 5.8 4.7  HGB 11.2* 11.4* 12.1  HCT 34.3* 35.7* 35.9*  MCV 92.5 93.2 90.2  PLT 262 162 238   Cardiac Enzymes: No results for input(s): CKTOTAL, CKMB, CKMBINDEX, TROPONINI in the last 168 hours. BNP: Invalid input(s): POCBNP CBG: Recent Labs  Lab 01/04/24 2311  GLUCAP 99   D-Dimer No results for input(s): DDIMER in the last 72 hours. Hgb A1c No results for input(s): HGBA1C in the last 72 hours. Lipid Profile No results for input(s): CHOL, HDL, LDLCALC, TRIG, CHOLHDL, LDLDIRECT in the last 72 hours. Thyroid function studies Recent Labs    01/05/24 0929  TSH 1.007   Anemia work up No results for input(s): VITAMINB12, FOLATE, FERRITIN, TIBC, IRON, RETICCTPCT in the last 72 hours. Urinalysis    Component Value Date/Time   COLORURINE STRAW (A)  01/04/2024 2314   APPEARANCEUR CLEAR 01/04/2024 2314   LABSPEC 1.002 (L) 01/04/2024 2314   PHURINE 6.0 01/04/2024 2314   GLUCOSEU NEGATIVE 01/04/2024 2314   HGBUR SMALL (A) 01/04/2024 2314   BILIRUBINUR NEGATIVE 01/04/2024 2314   KETONESUR NEGATIVE 01/04/2024 2314   PROTEINUR NEGATIVE 01/04/2024 2314   UROBILINOGEN 0.2 11/23/2011 1632   NITRITE NEGATIVE 01/04/2024 2314   LEUKOCYTESUR LARGE (A) 01/04/2024 2314   Sepsis Labs Recent Labs  Lab 01/04/24 2314 01/05/24 0929 01/06/24 0600  WBC 7.8 5.8 4.7   Microbiology Recent  Results (from the past 240 hours)  Resp panel by RT-PCR (RSV, Flu A&B, Covid) Anterior Nasal Swab     Status: None   Collection Time: 01/05/24  1:05 PM   Specimen: Anterior Nasal Swab  Result Value Ref Range Status   SARS Coronavirus 2 by RT PCR NEGATIVE NEGATIVE Final   Influenza A by PCR NEGATIVE NEGATIVE Final   Influenza B by PCR NEGATIVE NEGATIVE Final    Comment: (NOTE) The Xpert Xpress SARS-CoV-2/FLU/RSV plus assay is intended as an aid in the diagnosis of influenza from Nasopharyngeal swab specimens and should not be used as a sole basis for treatment. Nasal washings and aspirates are unacceptable for Xpert Xpress SARS-CoV-2/FLU/RSV testing.  Fact Sheet for Patients: BloggerCourse.com  Fact Sheet for Healthcare Providers: SeriousBroker.it  This test is not yet approved or cleared by the [place]  FDA and has been authorized for detection and/or diagnosis of SARS-CoV-2 by FDA under an Emergency Use Authorization (EUA). This EUA will remain in effect (meaning this test can be used) for the duration of the COVID-19 declaration under Section 564(b)(1) of the Act, 21 U.S.C. section 360bbb-3(b)(1), unless the authorization is terminated or revoked.     Resp Syncytial Virus by PCR NEGATIVE NEGATIVE Final    Comment: (NOTE) Fact Sheet for Patients: BloggerCourse.com  Fact Sheet for Healthcare Providers: SeriousBroker.it  This test is not yet approved or cleared by the [place]  FDA and has been authorized for detection and/or diagnosis of SARS-CoV-2 by FDA under an Emergency Use Authorization (EUA). This EUA will remain in effect (meaning this test can be used) for the duration of the COVID-19 declaration under Section 564(b)(1) of the Act, 21 U.S.C. section 360bbb-3(b)(1), unless the authorization is terminated or revoked.  Performed at [facility], [address], [place], [place] [address]      Time coordinating discharge: Over 30 minutes  SIGNED:   Brayton Lye, MD  [facility] 01/06/2024, 2:20 PM Pager   If 7PM-7AM, please contact night-coverage [email or web address]

## 2024-01-06 NOTE — Discharge Instructions (Signed)
Follow with Primary MD [place], [place] [facility] in 7 days   Get CBC, CMP,  checked  by Primary MD next visit.    Activity: As tolerated with Full fall precautions use walker/cane & assistance as needed   Disposition Home    Diet: Heart Healthy   On your next visit with your primary care physician please Get Medicines reviewed and adjusted.   Please request your Prim.MD to go over all Hospital Tests and Procedure/Radiological results at the follow up, please get all Hospital records sent to your Prim MD by signing hospital release before you go home.   If you experience worsening of your admission symptoms, develop shortness of breath, life threatening emergency, suicidal or homicidal thoughts you must seek medical attention immediately by calling 911 or calling your MD immediately  if symptoms less severe.  You Must read complete instructions/literature along with all the possible adverse reactions/side effects for all the Medicines you take and that have been prescribed to you. Take any new Medicines after you have completely understood and accpet all the possible adverse reactions/side effects.   Do not drive, operating heavy machinery, perform activities at heights, swimming or participation in water activities or provide baby sitting services if your were admitted for syncope or siezures until you have seen by Primary MD or a Neurologist and advised to do so again.  Do not drive when taking Pain medications.    Do not take more than prescribed Pain, Sleep and Anxiety Medications  Special Instructions: If you have smoked or chewed Tobacco  in the last 2 yrs please stop smoking, stop any regular Alcohol  and or any Recreational drug use.  Wear Seat belts while driving.   Please note  You were cared for by a hospitalist during your hospital stay. If you have any questions about your discharge medications or the care you received while you were in the hospital  after you are discharged, you can call the unit and asked to speak with the hospitalist on call if the hospitalist that took care of you is not available. Once you are discharged, your primary care physician will handle any further medical issues. Please note that NO REFILLS for any discharge medications will be authorized once you are discharged, as it is imperative that you return to your primary care physician (or establish a relationship with a primary care physician if you do not have one) for your aftercare needs so that they can reassess your need for medications and monitor your lab values.

## 2024-01-06 NOTE — Care Management (Signed)
  Transition of Care [facility]) Screening Note   Patient Details  Name: Kendra Becker Date of Birth: 10/18/1965   Transition of Care [facility]) CM/SW Contact:    Corean JAYSON Canary, RN Phone Number: 01/06/2024, 2:58 PM    Transition of Care Department [facility]) has reviewed patient and no TOC needs have been identified at this time. We will continue to monitor patient advancement through interdisciplinary progression rounds. If new patient transition needs arise, please place a TOC consult.

## 2024-01-06 NOTE — Evaluation (Signed)
 Physical Therapy Brief Evaluation and Discharge Note Patient Details Name: Kendra Becker MRN: [id number] DOB: January 23, 1966 Today's Date: 01/06/2024   History of Present Illness  Kendra Becker is a 58 y.o. female presents after having a syncopal episode and difficulty speaking and walking. Past medical history significant of hypertension, cerebral aneurysm s/p clipping.  Clinical Impression  Pt presents with admitting diagnosis above. Pt today was able to ambulate in hallway with no AD independently. PTA pt was fully independent. BP: 141/95 supine, BP: 152/109 seated, BP: 133/108 standing, BP: 154/97 supine after ambulation. Pt presents at or near baseline mobility. Pt has no further acute PT needs and will be signing off. Re consult PT if mobility status changes. Pt would benefit from continued mobility with mobility specialist during acute stay.        PT Assessment Patient does not need any further PT services  Assistance Needed at Discharge  PRN    Equipment Recommendations None recommended by PT  Recommendations for Other Services       Precautions/Restrictions Precautions Precautions: Fall Recall of Precautions/Restrictions: Intact Restrictions Weight Bearing Restrictions Per Provider Order: No        Mobility  Bed Mobility   Supine/Sidelying to sit: Independent Sit to supine/sidelying: Independent    Transfers Overall transfer level: Independent Equipment used: None                    Ambulation/Gait Ambulation/Gait assistance: Independent Gait Distance (Feet): 450 Feet Assistive device: None Gait Pattern/deviations: WFL(Within Functional Limits) Gait Speed: Pace WFL General Gait Details: no LOB noted.  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    Prior Function Level of Independence: Independent      UE/LE Assessment   UE ROM/Strength/Tone/Coordination: WFL    LE ROM/Strength/Tone/Coordination: Navos      Communication   Communication Communication: No apparent difficulties     Cognition Overall Cognitive Status: Appears within functional limits for tasks assessed/performed       General Comments General comments (skin integrity, edema, etc.): BP: 141/95 supine, BP: 152/109 seated, BP: 133/108 standing, BP: 154/97 supine after ambulation.    Exercises     Assessment/Plan    PT Problem List         PT Visit Diagnosis Other abnormalities of gait and mobility (R26.89)    No Skilled PT Patient at baseline level of functioning;Patient is independent with all acitivity/mobility   Co-evaluation                AMPAC 6 Clicks Help needed turning from your back to your side while in a flat bed without using bedrails?: None Help needed moving from lying on your back to sitting on the side of a flat bed without using bedrails?: None Help needed moving to and from a bed to a  chair (including a wheelchair)?: None Help needed standing up from a chair using your arms (e.g., wheelchair or bedside chair)?: None Help needed to walk in hospital room?: None Help needed climbing 3-5 steps with a railing? : None 6 Click Score: 24      End of Session Equipment Utilized During Treatment: Gait belt Activity Tolerance: Patient tolerated treatment well Patient left: in bed;with call bell/phone within reach;with family/visitor  present Nurse Communication: Mobility status PT Visit Diagnosis: Other abnormalities of gait and mobility (R26.89)     Time: 9063-9045 PT Time Calculation (min) (ACUTE ONLY): 18 min  Charges:   PT Evaluation $PT Eval Low Complexity: 1 Low     Franciszek Platten B, PT, DPT Acute Rehab Services [phone]   Ryenne Lynam  01/06/2024, 11:43 AM

## 2024-01-07 NOTE — Telephone Encounter (Signed)
 Received a message from RN Triage earlier this afternoon:  Patient was recently admitted to the hospital for hypotension and a syncopal episode  She has a history of significant unintended weight loss. She relates a previous similar episode of syncope to weight loss and adjustments in blood pressure medication. She is currently on two blood pressure medications.  She was instructed to hold her BP medications at discharge until she was able to see PCP.  However, she has been getting high BP readings.  Headache started yesterday afternoon.  Took BP around 9AM today and it was 161/111.  Took again earlier this afternoon and it was 159/101.  Patient reports previously being on Lisinopril  30 mg and Amlodipine 5 mg for her BP.  At the time of the triage call, her headache was 5/10 (with no pain medication taken), no vision changes, difficulty breathing, or chest pain.  Patient inquiring about restarting BP medications.  I advised that she should start one of the medications this weekend, but not both.  RN instructed to inform patient that she will restart Amlodipine 5 mg daily starting today and wait to go back onto her Lisinopril  30 mg until I can speak with her PCP next week.  Patient will send some readings through the portal while taking Amlodipine 5 mg.  She is to inform us  immediately if she sees low BP readings or becomes light-headed again.  RN instructed to inform patient that she needs to go back to the ER with any further low BP readings associated with syncope.

## 2024-01-08 NOTE — Telephone Encounter (Signed)
 Noted

## 2024-01-14 ENCOUNTER — Encounter (HOSPITAL_COMMUNITY): Payer: Self-pay

## 2024-01-14 ENCOUNTER — Ambulatory Visit (HOSPITAL_COMMUNITY)
Admission: EM | Admit: 2024-01-14 | Discharge: 2024-01-14 | Disposition: A | Discharge status: Home / Self Care | Attending: Emergency Medicine | Admitting: Emergency Medicine

## 2024-01-14 DIAGNOSIS — L03211 Cellulitis of face: Secondary | ICD-10-CM | POA: Diagnosis not present

## 2024-01-14 MED ORDER — AMOXICILLIN-POT CLAVULANATE 875-125 MG PO TABS: 1.0000 | ORAL_TABLET | Freq: Two times a day (BID) | ORAL | 0 refills | Status: AC

## 2024-01-14 MED ORDER — LIDOCAINE HCL (PF) 1 % IJ SOLN
INTRAMUSCULAR | Status: AC
  Filled 2024-01-14: qty 2

## 2024-01-14 MED ORDER — CEFTRIAXONE SODIUM 1 G IJ SOLR
1.0000 g | Freq: Once | INTRAMUSCULAR | Status: AC
  Administered 2024-01-14: 1 g via INTRAMUSCULAR

## 2024-01-14 MED ORDER — CEFTRIAXONE SODIUM 1 G IJ SOLR
INTRAMUSCULAR | Status: AC
  Filled 2024-01-14: qty 10

## 2024-01-14 NOTE — Discharge Instructions (Addendum)
 You were given an injection of Rocephin  in clinic today for cellulitis. Starting Augmentin  twice daily for 7 days for cellulitis. You can take 500 to 1000 mg of Tylenol  every 6-8 hours as needed for pain or any fever.  Do not exceed 4000 mg in 1 day. If you develop increased swelling, spreading of redness, increased pain, persistent fever, weakness, or difficulty swallowing or breathing please seek immediate medical treatment in the emergency department.

## 2024-01-14 NOTE — ED Provider Notes (Signed)
 [facility]    CSN: [id number] Arrival date & time: 01/14/24  1354      History   Chief Complaint Chief Complaint  Patient presents with   Dental Pain    HPI Kendra Becker is a 58 y.o. female.   Patient presents with left lower jaw pain and swelling that began on the evening of 8/28.  Patient denies any dental or mouth pain related to this.  Patient states that at first she did has had some pain to her jaw that was tender to touch and then began to notice some significant swelling which has worsened today.    Patient states that she has had some chills as well.  Patient denies any known fever, body aches, sore throat, cough, congestion, nausea, and vomiting.  Patient reports that she did break a tooth on the left lower side of her mouth a couple weeks ago and wonders if this could be related.  The history is provided by the patient and medical records.  Dental Pain   Past Medical History:  Diagnosis Date   Anal warts    Asthma    History of cerebral aneurysm repair    03-17-2006 clipping anterior communicating artery segment, unruptured   History of diverticulitis of colon    Hypertension    Wears glasses     Patient Active Problem List   Diagnosis Date Noted   Hypotension 01/05/2024   Syncope and collapse 01/05/2024   Stroke-like symptoms 01/05/2024   Normocytic anemia 01/05/2024   Hypokalemia 01/05/2024   Anxiety 01/05/2024   History of cerebral aneurysm 01/05/2024   Weight loss 01/05/2024   History of diverticulitis 01/05/2024   Tobacco abuse 01/05/2024   GERD (gastroesophageal reflux disease) 01/05/2024   Diverticulitis 09/21/2015    Past Surgical History:  Procedure Laterality Date   LAPAROSCOPIC CHOLECYSTECTOMY  01/18/2003   LAPAROSCOPY APPENDECTOMY AND PARTIAL CECECTOMY  2000   LASER ABLATION CONDOLAMATA N/A 05/20/2016   Procedure: EXCISION AND LASER ABLATION OF ANAL CONDYLOMA;  Surgeon: Krystal Russell, MD;  Location: [facility];  Service: General;  Laterality: N/A;   RIGHT PTERIONAL CRANIOTOMY CLIPPING ANTERIOR COMMUNICATING ARTERY SEGMENT ANEURYSM MICRODISSECTION  03/17/2006   unruptured    OB History   No obstetric history on file.      Home Medications    Prior to Admission medications   Medication Sig Start Date End Date Taking? Authorizing Provider  amoxicillin -clavulanate (AUGMENTIN ) 875-125 MG tablet Take 1 tablet by mouth every 12 (twelve) hours. 01/14/24  Yes Johnie, Lyndel Dancel A, NP  albuterol  (VENTOLIN  HFA) 108 (90 Base) MCG/ACT inhaler Inhale 1-2 puffs into the lungs every 6 (six) hours as needed for wheezing or shortness of breath. 09/12/23   Raspet, Erin K, [name]  ALPRAZolam  (XANAX ) 0.5 MG tablet Take 0.5 mg by mouth at bedtime.    [provider]  Blood Pressure Monitor MISC Use as directed. 01/06/24   Elgergawy, Brayton RAMAN, MD  cetirizine  (ZYRTEC  ALLERGY) 10 MG tablet Take 1 tablet (10 mg total) by mouth daily. Patient taking differently: Take 10 mg by mouth at bedtime. 07/16/20   Stuart Vernell Norris, [name]  escitalopram  (LEXAPRO ) 20 MG tablet Take 20 mg by mouth at bedtime. 12/23/23   [provider]  gabapentin  (NEURONTIN ) 300 MG capsule Take 300 mg by mouth 2 (two) times daily. 12/26/23   [provider]  ondansetron  (ZOFRAN -ODT) 8 MG disintegrating tablet Take 8 mg by mouth every 8 (eight) hours as needed for vomiting  or nausea. 11/19/23   [provider]  pantoprazole  (PROTONIX ) 40 MG tablet Take 40 mg by mouth daily.    [provider]    Family History Family History  Problem Relation Age of Onset   Hypertension Mother    Aneurysm Father     Social History Social History   Tobacco Use   Smoking status: Every Day    Current packs/day: 1.50    Average packs/day: 1.5 packs/day for 28.0 years (42.0 ttl pk-yrs)    Types: Cigarettes   Smokeless tobacco: Never  Vaping Use   Vaping status: Never Used  Substance Use Topics   Alcohol use: Yes     Comment: occasional   Drug use: No     Allergies   Flagyl  [metronidazole ]   Review of Systems Review of Systems  Per HPI  Physical Exam Triage Vital Signs ED Triage Vitals  Encounter Vitals Group     BP 01/14/24 1520 (!) 131/91     Girls Systolic BP Percentile --      Girls Diastolic BP Percentile --      Boys Systolic BP Percentile --      Boys Diastolic BP Percentile --      Pulse Rate 01/14/24 1520 (!) 109     Resp 01/14/24 1520 16     Temp 01/14/24 1520 99.2 F (37.3 C)     Temp Source 01/14/24 1520 Oral     SpO2 01/14/24 1520 98 %     Weight --      Height --      Head Circumference --      Peak Flow --      Pain Score 01/14/24 1521 9     Pain Loc --      Pain Education --      Exclude from Growth Chart --    No data found.  Updated Vital Signs BP (!) 131/91 (BP Location: Left Arm)   Pulse (!) 109   Temp 99.2 F (37.3 C) (Oral)   Resp 16   LMP 09/15/2011   SpO2 98%   Visual Acuity Right Eye Distance:   Left Eye Distance:   Bilateral Distance:    Right Eye Near:   Left Eye Near:    Bilateral Near:     Physical Exam Vitals and nursing note reviewed.  Constitutional:      General: She is awake. She is not in acute distress.    Appearance: Normal appearance. She is well-developed and well-groomed. She is ill-appearing. She is not toxic-appearing or diaphoretic.  HENT:     Head:     Jaw: Tenderness and swelling present.      Comments: Diffuse swelling, tenderness, and erythema noted to left lower jaw.    Mouth/Throat:     Dentition: Abnormal dentition. Dental caries present. No dental tenderness, gingival swelling or dental abscesses.      Comments: Left lower broken tooth noted.  Multiple dental caries and missing teeth noted throughout.  Without dental tenderness or gingival swelling. Skin:    General: Skin is warm and dry.     Findings: Erythema present.  Neurological:     Mental Status: She is alert.  Psychiatric:        Behavior:  Behavior is cooperative.      UC Treatments / Results  Labs (all labs ordered are listed, but only abnormal results are displayed) Labs Reviewed - No data to display  EKG   Radiology No results found.  Procedures Procedures (including critical care time)  Medications Ordered in UC Medications  cefTRIAXone  (ROCEPHIN ) injection 1 g (1 g Intramuscular Given 01/14/24 1613)    Initial Impression / Assessment and Plan / UC Course  I have reviewed the triage vital signs and the nursing notes.  Pertinent labs & imaging results that were available during my care of the patient were reviewed by me and considered in my medical decision making (see chart for details).     Patient is mildly ill-appearing.  Vitals are stable however mild tachycardia is present and temperature is slightly elevated at 99.2.  Exam findings consistent with facial cellulitis of the left lower jaw.  Given IM Rocephin  in clinic.  Prescribed Augmentin .  Recommended Tylenol  as needed for pain.  Discussed follow-up, return, and strict ER precautions. Final Clinical Impressions(s) / UC Diagnoses   Final diagnoses:  Facial cellulitis     Discharge Instructions      You were given an injection of Rocephin  in clinic today for cellulitis. Starting Augmentin  twice daily for 7 days for cellulitis. You can take 500 to 1000 mg of Tylenol  every 6-8 hours as needed for pain or any fever.  Do not exceed 4000 mg in 1 day. If you develop increased swelling, spreading of redness, increased pain, persistent fever, weakness, or difficulty swallowing or breathing please seek immediate medical treatment in the emergency department.   ED Prescriptions     Medication Sig Dispense Auth. Provider   amoxicillin -clavulanate (AUGMENTIN ) 875-125 MG tablet Take 1 tablet by mouth every 12 (twelve) hours. 14 tablet Johnie Flaming A, NP      PDMP not reviewed this encounter.   Johnie Flaming A, NP 01/14/24 [phone]

## 2024-01-14 NOTE — ED Triage Notes (Signed)
 Patient here today with c/o left lower dental pain and swelling since Wednesday. Worsened yesterday.
